# Patient Record
Sex: Male | Born: 1950 | Race: White | Hispanic: No | Marital: Married | Smoking: Former smoker
Health system: Southern US, Community
[De-identification: ages and names within clinical notes are randomized; demographics above are authoritative.]

## PROBLEM LIST (undated history)

## (undated) DIAGNOSIS — R5383 Other fatigue: Secondary | ICD-10-CM

## (undated) DIAGNOSIS — D72819 Decreased white blood cell count, unspecified: Secondary | ICD-10-CM

## (undated) DIAGNOSIS — I82409 Acute embolism and thrombosis of unspecified deep veins of unspecified lower extremity: Secondary | ICD-10-CM

## (undated) DIAGNOSIS — K5790 Diverticulosis of intestine, part unspecified, without perforation or abscess without bleeding: Secondary | ICD-10-CM

## (undated) DIAGNOSIS — M1990 Unspecified osteoarthritis, unspecified site: Secondary | ICD-10-CM

## (undated) DIAGNOSIS — I10 Essential (primary) hypertension: Secondary | ICD-10-CM

## (undated) DIAGNOSIS — E039 Hypothyroidism, unspecified: Secondary | ICD-10-CM

## (undated) DIAGNOSIS — C029 Malignant neoplasm of tongue, unspecified: Secondary | ICD-10-CM

## (undated) DIAGNOSIS — D649 Anemia, unspecified: Secondary | ICD-10-CM

## (undated) DIAGNOSIS — I2699 Other pulmonary embolism without acute cor pulmonale: Secondary | ICD-10-CM

## (undated) HISTORY — DX: Diverticulosis of intestine, part unspecified, without perforation or abscess without bleeding: K57.90

## (undated) HISTORY — DX: Hypothyroidism, unspecified: E03.9

## (undated) HISTORY — PX: KNEE SURGERY: SHX244

## (undated) HISTORY — DX: Decreased white blood cell count, unspecified: D72.819

## (undated) HISTORY — DX: Other pulmonary embolism without acute cor pulmonale: I26.99

## (undated) HISTORY — DX: Other fatigue: R53.83

## (undated) HISTORY — DX: Malignant neoplasm of tongue, unspecified: C02.9

## (undated) HISTORY — DX: Acute embolism and thrombosis of unspecified deep veins of unspecified lower extremity: I82.409

## (undated) HISTORY — DX: Unspecified osteoarthritis, unspecified site: M19.90

## (undated) HISTORY — DX: Anemia, unspecified: D64.9

## (undated) HISTORY — DX: Essential (primary) hypertension: I10

---

## 1999-09-26 ENCOUNTER — Encounter: Payer: Self-pay | Admitting: Orthopedic Surgery

## 1999-09-27 ENCOUNTER — Inpatient Hospital Stay (HOSPITAL_COMMUNITY): Admission: RE | Admit: 1999-09-27 | Discharge: 1999-10-01 | Payer: Self-pay | Admitting: Orthopedic Surgery

## 1999-10-21 ENCOUNTER — Inpatient Hospital Stay (HOSPITAL_COMMUNITY): Admission: RE | Admit: 1999-10-21 | Discharge: 1999-10-23 | Payer: Self-pay | Admitting: Orthopedic Surgery

## 2000-04-30 ENCOUNTER — Inpatient Hospital Stay (HOSPITAL_COMMUNITY): Admission: AD | Admit: 2000-04-30 | Discharge: 2000-05-04 | Payer: Self-pay | Admitting: Orthopedic Surgery

## 2000-05-03 ENCOUNTER — Encounter: Payer: Self-pay | Admitting: Orthopedic Surgery

## 2000-05-04 ENCOUNTER — Encounter: Payer: Self-pay | Admitting: Orthopedic Surgery

## 2000-05-09 ENCOUNTER — Ambulatory Visit (HOSPITAL_COMMUNITY): Admission: RE | Admit: 2000-05-09 | Discharge: 2000-05-09 | Payer: Self-pay | Admitting: Orthopedic Surgery

## 2000-07-18 ENCOUNTER — Encounter (INDEPENDENT_AMBULATORY_CARE_PROVIDER_SITE_OTHER): Payer: Self-pay | Admitting: Specialist

## 2000-07-18 ENCOUNTER — Inpatient Hospital Stay (HOSPITAL_COMMUNITY): Admission: RE | Admit: 2000-07-18 | Discharge: 2000-07-23 | Payer: Self-pay | Admitting: Orthopedic Surgery

## 2000-07-23 ENCOUNTER — Encounter: Payer: Self-pay | Admitting: Orthopedic Surgery

## 2000-08-27 ENCOUNTER — Encounter: Admission: RE | Admit: 2000-08-27 | Discharge: 2000-08-27 | Payer: Self-pay | Admitting: Infectious Diseases

## 2000-11-08 ENCOUNTER — Encounter: Admission: RE | Admit: 2000-11-08 | Discharge: 2000-11-08 | Payer: Self-pay | Admitting: Internal Medicine

## 2000-11-19 ENCOUNTER — Encounter: Payer: Self-pay | Admitting: Orthopedic Surgery

## 2000-11-20 ENCOUNTER — Encounter: Payer: Self-pay | Admitting: Orthopedic Surgery

## 2000-11-20 ENCOUNTER — Encounter (INDEPENDENT_AMBULATORY_CARE_PROVIDER_SITE_OTHER): Payer: Self-pay | Admitting: *Deleted

## 2000-11-20 ENCOUNTER — Inpatient Hospital Stay (HOSPITAL_COMMUNITY): Admission: RE | Admit: 2000-11-20 | Discharge: 2000-11-23 | Payer: Self-pay | Admitting: Orthopedic Surgery

## 2004-07-24 DIAGNOSIS — I2699 Other pulmonary embolism without acute cor pulmonale: Secondary | ICD-10-CM

## 2004-07-24 DIAGNOSIS — I82409 Acute embolism and thrombosis of unspecified deep veins of unspecified lower extremity: Secondary | ICD-10-CM

## 2004-07-24 HISTORY — PX: ABOVE KNEE LEG AMPUTATION: SUR20

## 2004-07-24 HISTORY — DX: Other pulmonary embolism without acute cor pulmonale: I26.99

## 2004-07-24 HISTORY — DX: Acute embolism and thrombosis of unspecified deep veins of unspecified lower extremity: I82.409

## 2005-03-24 ENCOUNTER — Ambulatory Visit: Payer: Self-pay | Admitting: Infectious Diseases

## 2005-03-24 ENCOUNTER — Encounter (INDEPENDENT_AMBULATORY_CARE_PROVIDER_SITE_OTHER): Payer: Self-pay | Admitting: *Deleted

## 2005-03-24 ENCOUNTER — Inpatient Hospital Stay (HOSPITAL_COMMUNITY): Admission: AD | Admit: 2005-03-24 | Discharge: 2005-04-14 | Payer: Self-pay | Admitting: Orthopedic Surgery

## 2005-04-01 ENCOUNTER — Ambulatory Visit: Payer: Self-pay | Admitting: Critical Care Medicine

## 2005-04-10 ENCOUNTER — Ambulatory Visit: Payer: Self-pay | Admitting: Oncology

## 2005-04-11 ENCOUNTER — Encounter (INDEPENDENT_AMBULATORY_CARE_PROVIDER_SITE_OTHER): Payer: Self-pay | Admitting: *Deleted

## 2005-04-19 ENCOUNTER — Ambulatory Visit: Payer: Self-pay | Admitting: Infectious Diseases

## 2005-04-20 ENCOUNTER — Ambulatory Visit: Payer: Self-pay | Admitting: Critical Care Medicine

## 2005-04-24 ENCOUNTER — Ambulatory Visit: Payer: Self-pay | Admitting: Cardiology

## 2005-04-27 ENCOUNTER — Ambulatory Visit (HOSPITAL_COMMUNITY): Admission: RE | Admit: 2005-04-27 | Discharge: 2005-04-27 | Payer: Self-pay | Admitting: Interventional Radiology

## 2005-05-01 ENCOUNTER — Ambulatory Visit: Payer: Self-pay | Admitting: Cardiology

## 2005-05-08 ENCOUNTER — Ambulatory Visit: Payer: Self-pay | Admitting: Internal Medicine

## 2005-05-15 ENCOUNTER — Ambulatory Visit: Payer: Self-pay | Admitting: Cardiology

## 2005-05-15 ENCOUNTER — Ambulatory Visit: Payer: Self-pay | Admitting: Critical Care Medicine

## 2005-06-05 ENCOUNTER — Ambulatory Visit: Payer: Self-pay

## 2005-06-20 ENCOUNTER — Ambulatory Visit: Payer: Self-pay | Admitting: Critical Care Medicine

## 2005-09-20 ENCOUNTER — Encounter: Admission: RE | Admit: 2005-09-20 | Discharge: 2005-11-16 | Payer: Self-pay | Admitting: Orthopedic Surgery

## 2006-05-05 IMAGING — CT CT ANGIO CHEST
1 of 3 series · 18 of 32 positions shown · IV contrast (APPLIED)
Comparison: Plain film of chest 04/02/2005.

CLINICAL DATA: Increasing shortness of breath status post total knee replacement.  
CT ANGIOGRAPHY OF CHEST:
TECHNIQUE: Multidetector CT imaging of the chest was performed during bolus injection of intravenous contrast.  Multiplanar CT angiographic image reconstructions were generated to evaluate the vascular anatomy.
Contrast:  100ml Omnipaque 300.

[Series 10: pulm embolism 2.0 st · axial · 0.61mm/px · z∈[-327,-67]mm · 18 of 142 slices shown]
[im 6/142  lung]
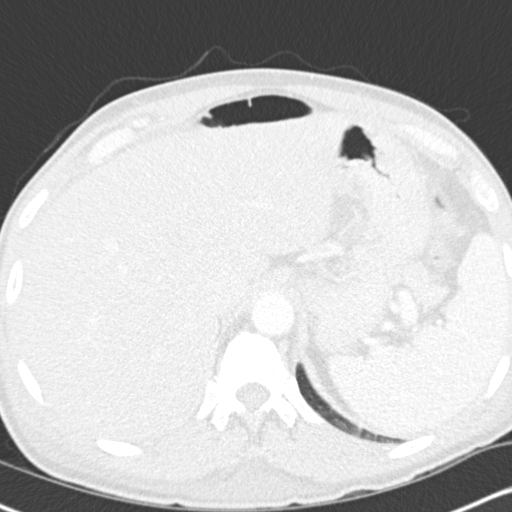
[im 16/142  soft-tissue]
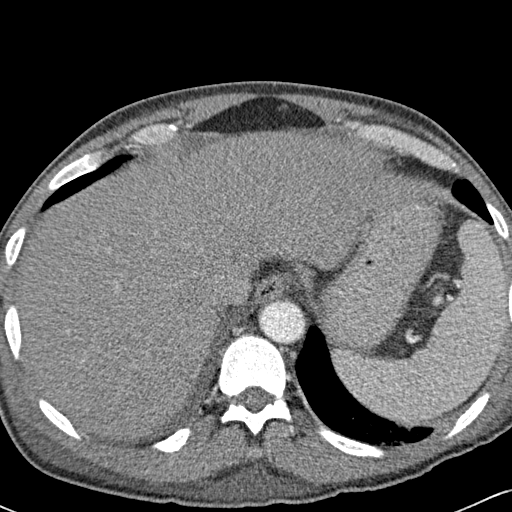
[im 21/142  lung]
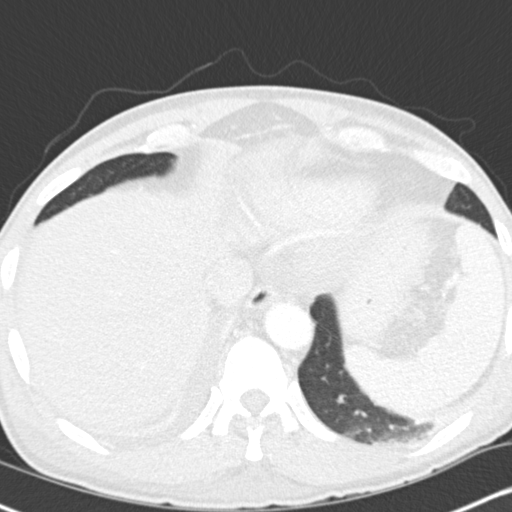
[im 31/142  soft-tissue]
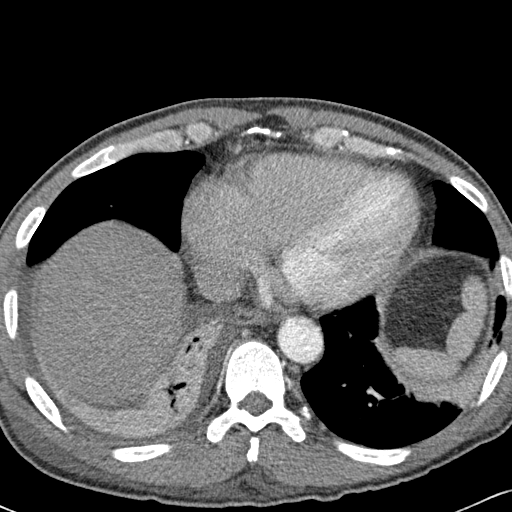
[im 36/142  lung]
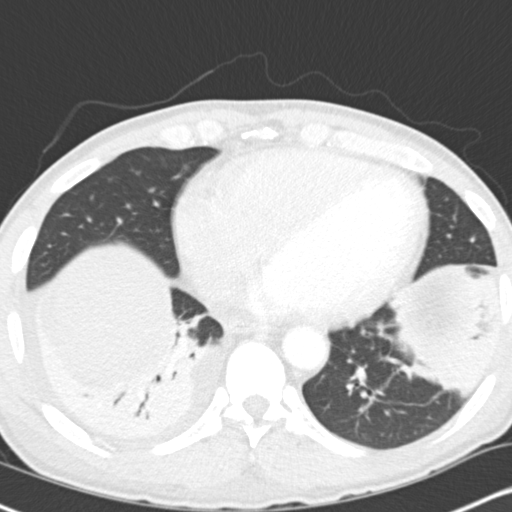
[im 46/142  soft-tissue]
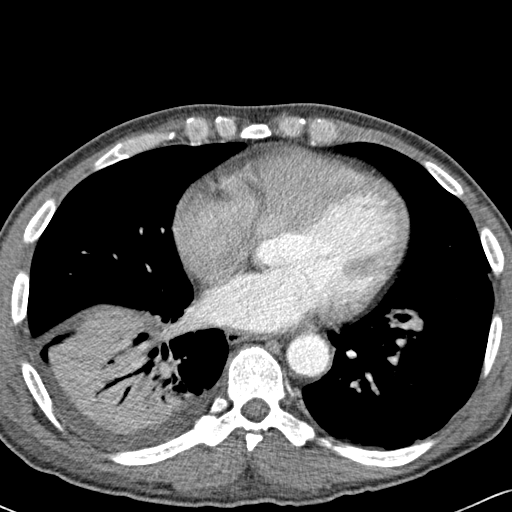
[im 51/142  lung]
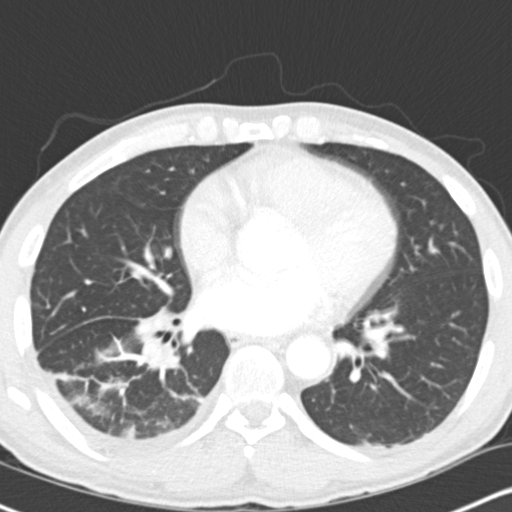
[im 61/142  soft-tissue]
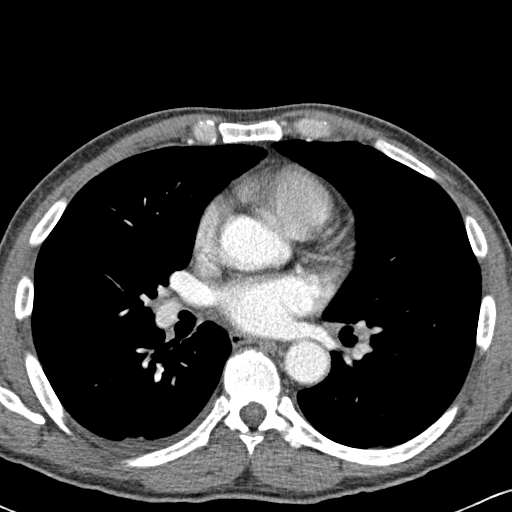
[im 66/142  lung]
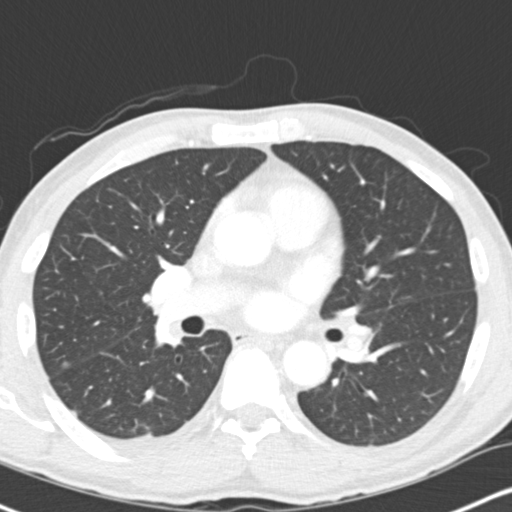
[im 76/142  soft-tissue]
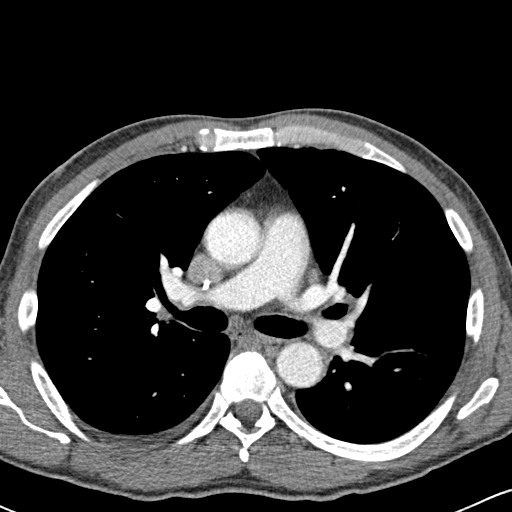
[im 81/142  lung]
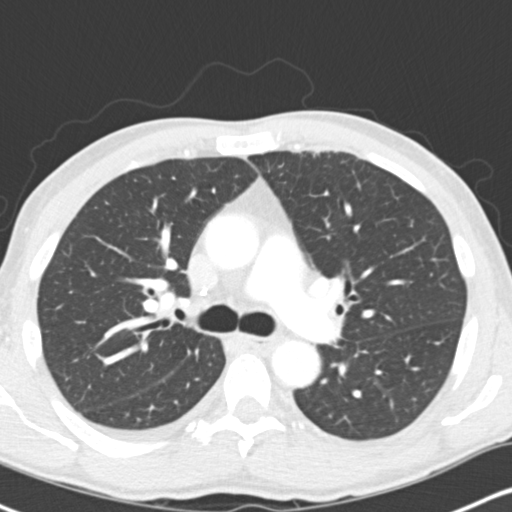
[im 91/142  soft-tissue]
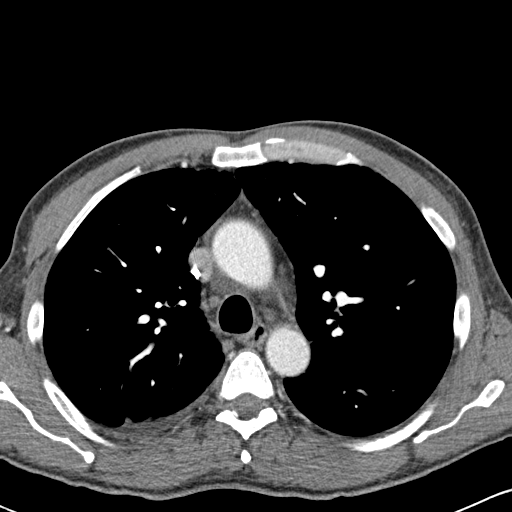
[im 96/142  lung]
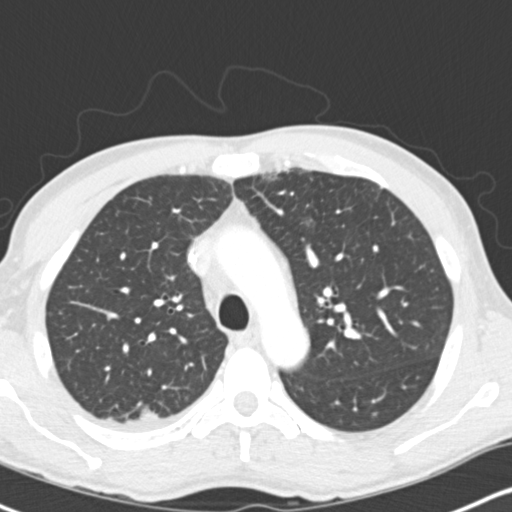
[im 106/142  soft-tissue]
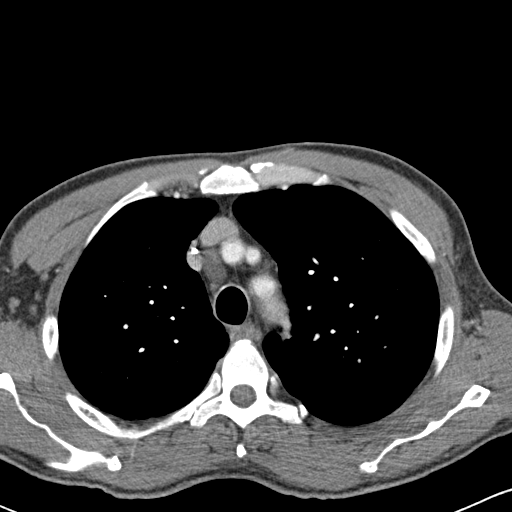
[im 111/142  lung]
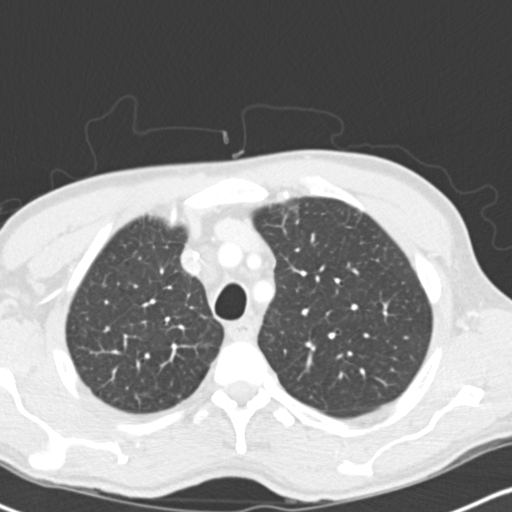
[im 121/142  soft-tissue]
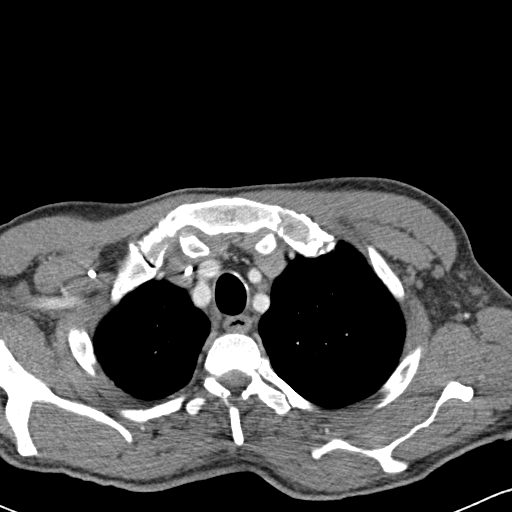
[im 126/142  lung]
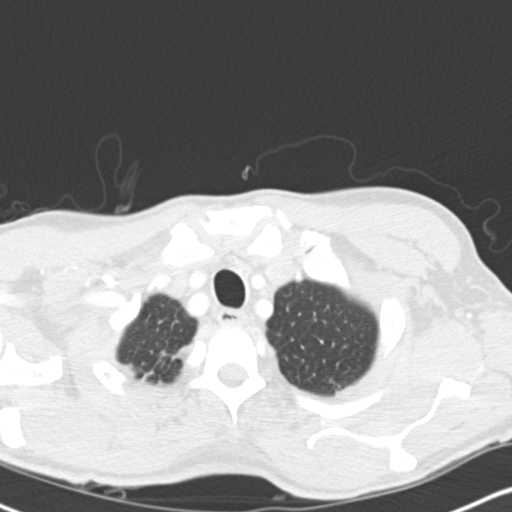
[im 136/142  soft-tissue]
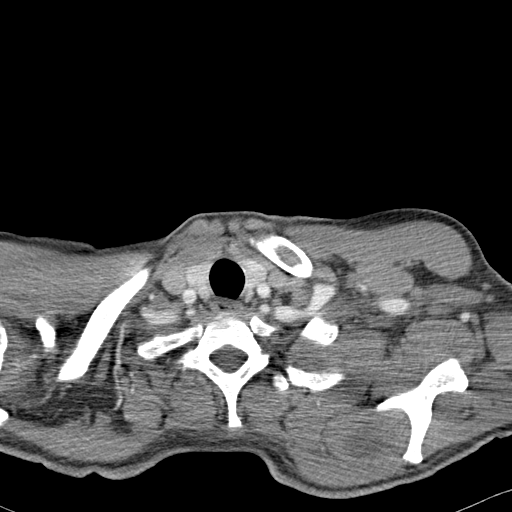

[18 of 32 positions shown; findings below may reference images not displayed]

No prior CTs available.  
Lung windows demonstrate right apical pleural parenchymal scarring.  A 1 to 2 mm nodule is seen in the subpleural right upper lobe on image 38.  Similar nodule right upper lobe image 40.  Numerous other tiny nodules are seen throughout the lungs example image 46 right upper lobe.  There is airspace disease in the right lower lobe without significant volume loss which is most consistent with pneumonia.  More platelike airspace disease is seen in the left lung base and likely represents subsegmental atelectasis. 
The contrast bolus is suboptimally timed.  This limits evaluation for subsegmental and small segmental pulmonary emboli.  Given this limitation, there is no evidence of a large pulmonary embolism.  Please note that especially at the lung bases, pulmonary arterial enhancement is suboptimal. 
Right-sided central line terminates at the superior caval/atrial junction.  Low density in the right paratracheal region felt to represent pericardial recess fluid.  Heart is mildly enlarged.  Small mediastinal nodes none of which meet CT size criterial for adenopathy.  Prominent right hilar nodal tissue likely inflammatory.  Small right-sided pleural effusion which appears simple.  
Limited imaging of the upper abdomen demonstrates hypoattenuation in the liver likely representing hepatic steatosis.  
Bone windows demonstrate no focal osseous lesion.
IMPRESSION: 1.  Mildly limited examination.  No evidence of large pulmonary embolism.  
2.  Evidence of right lower lobe pneumonia with small right-sided pleural effusion.  
3.  Cardiomegaly.  
4.  Scattered lung nodules which are all diminutive.  If there are risk factors for lung cancer, 1-year followup chest CT recommended. 
5.  Fatty liver.

## 2006-05-06 IMAGING — XA IR US GUIDE VASC ACCESS RIGHT
1 series · 8 of 8 positions shown · non-contrast
Comparison: none

CLINICAL DATA: Infected left total knee replacement. DVT. Cannot be
anticoagulated.

[Series 1: run · 8 of 8 slices shown]
[im 1/8]
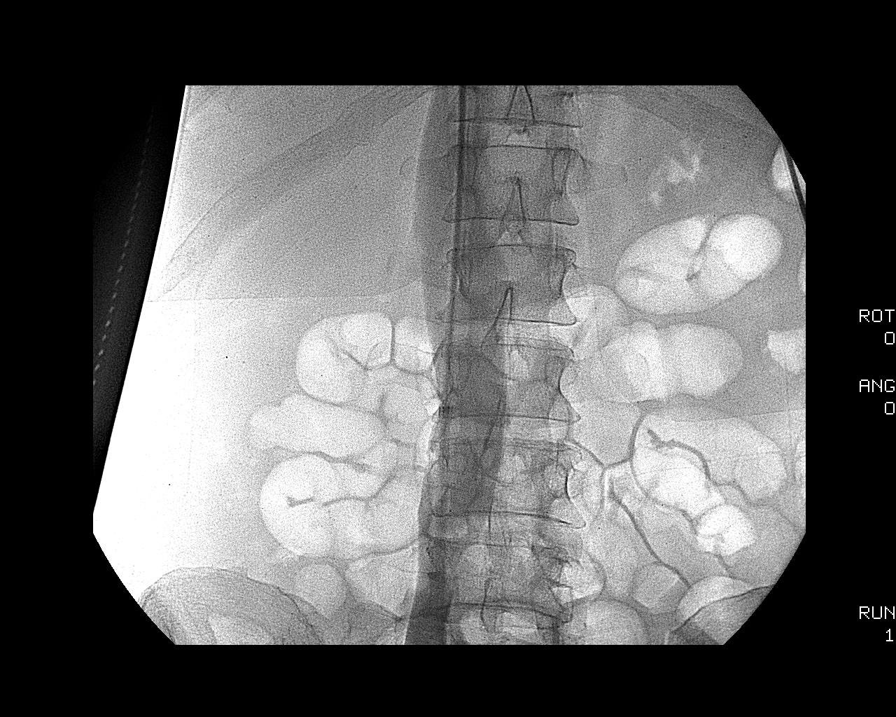
[im 2/8]
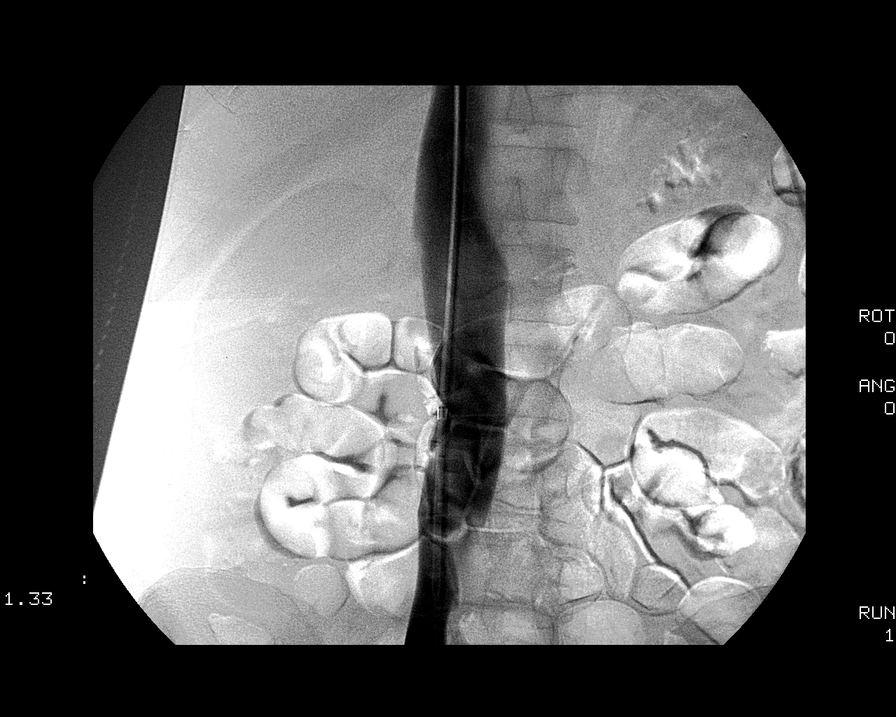
[im 3/8]
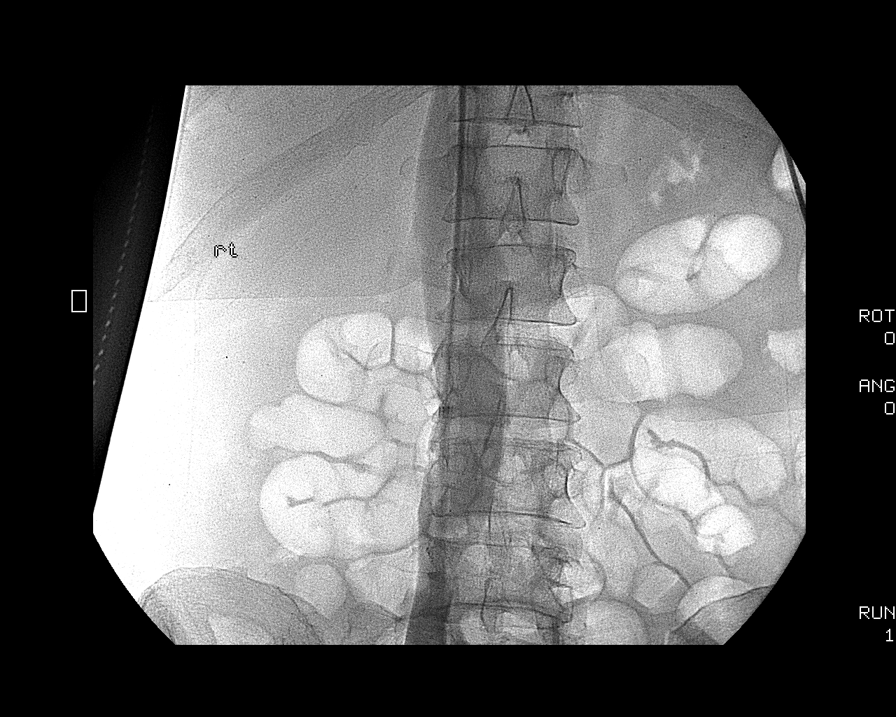
[im 4/8]
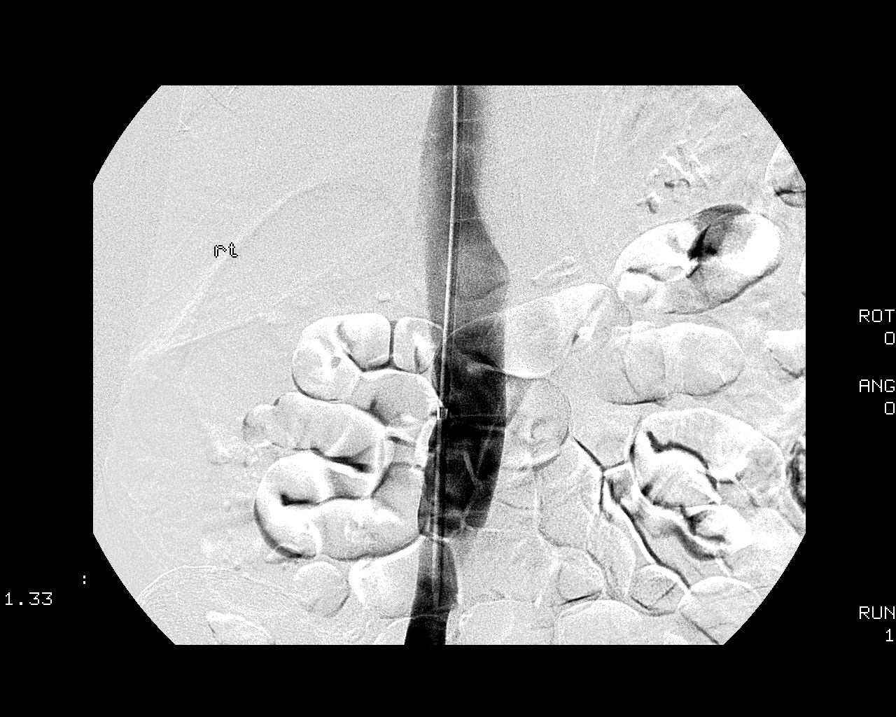
[im 5/8]
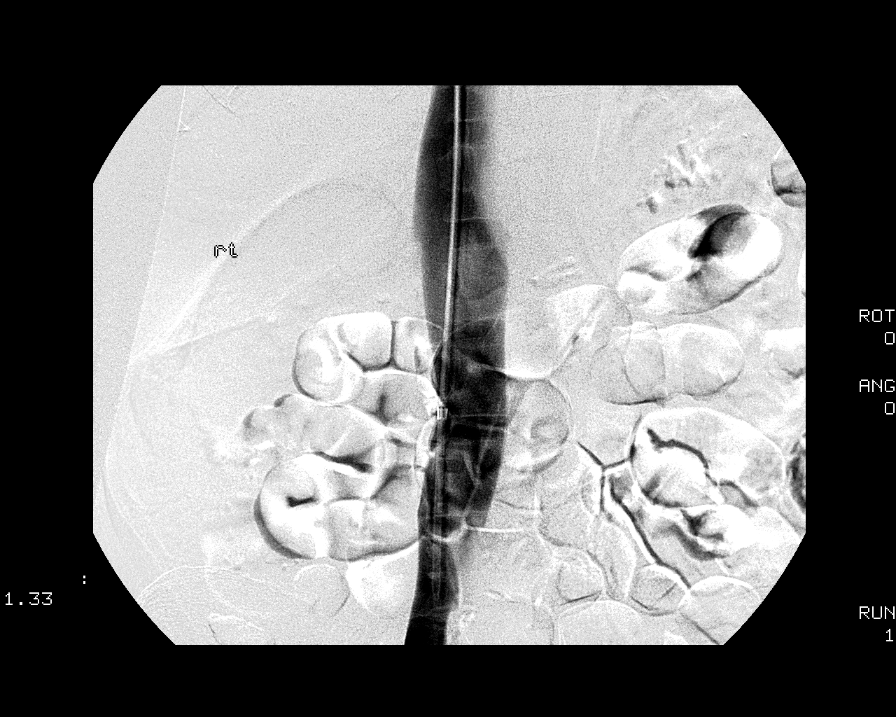
[im 6/8]
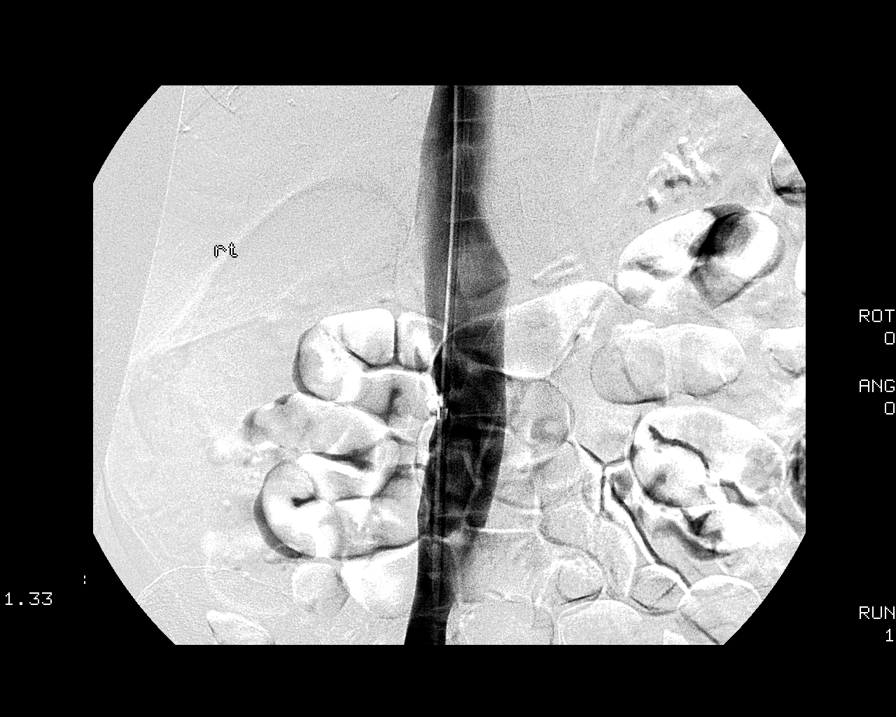
[im 7/8]
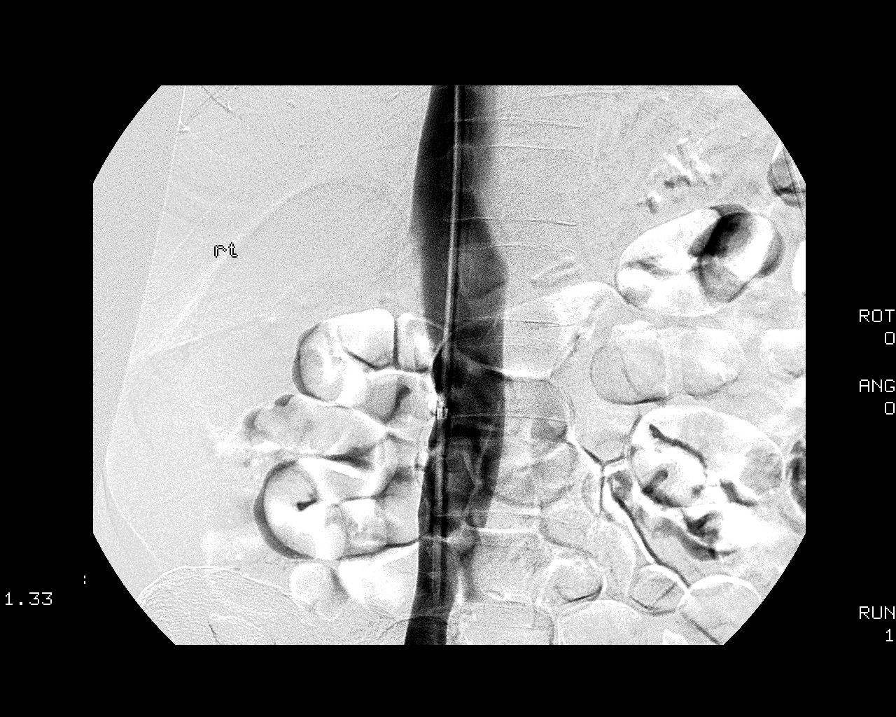
[im 8/8]
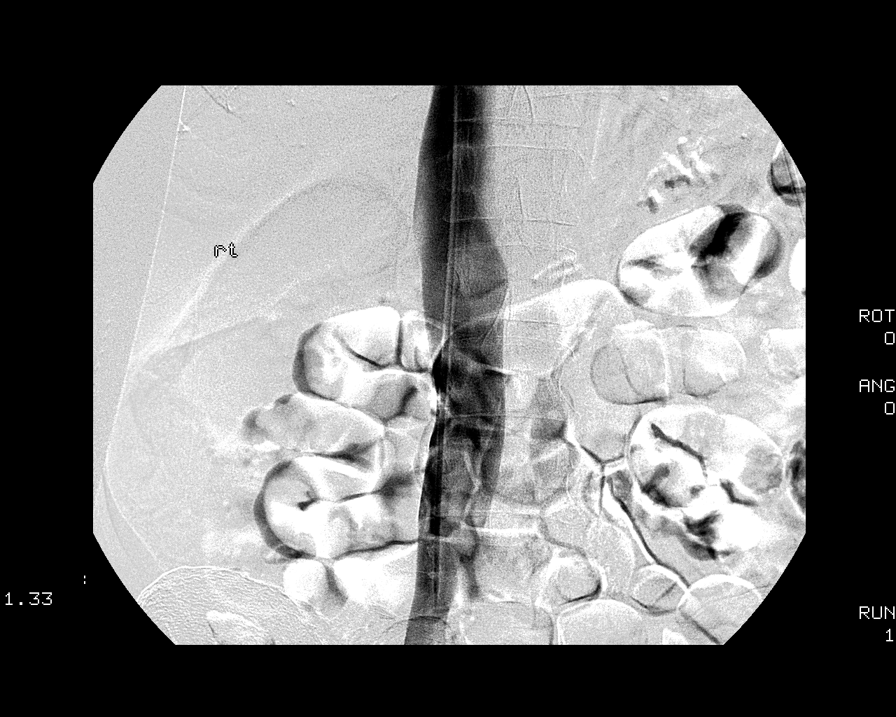

[8 of 8 positions shown; findings below may reference images not displayed]

PROCEDURE:
1. Ultrasound guided vascular access:
2. Inferior venacavogram:
3. IVC filter placement:

Anesthesia/meds: IV Versed and fentanyl for conscious sedation.
Cardiorespiratory monitoring was performed throughout the procedure by the
radiology nurse.

Complications: None

Filter: Retrievable G-2
FINDINGS: Written informed consent was obtained from the patient for the procedure. The
patient was placed supine on the angiography table and the right neck was
prepped and draped in sterile fashion. Ultrasound verifies patency of the right
internal jugular vein. Under ultrasound guidance, a micropuncture needle was
used to access the right internal jugular vein, and a 0.018 guidewire was
advanced centrally under fluoroscopic guidance, then exchanged for a
guidewire through the microconversion sheath. This was advanced into the IVC.
The 10 French introducer sheath was then advanced over the guidewire and used to
perform inferior venacavogram. This shows normal caliber IVC. No intraluminal
filling defects within the IVC to suggest clot. Inflow from the opposite iliac
vein and renal veins was noted.

Under fluoroscopic guidance, the retrievable G-2 IVC filter was deployed in an
infrarenal location.

Following the procedure, hemostasis was achieved at the right neck with manual
compression.
IMPRESSION: 1. Unremarkable inferior venacavogram.
2. Successful placement of infrarenal retrievable G-2 IVC filter. This can be
retrieved within 3 weeks or can function as a permanent filter if felt needed.

## 2006-09-04 ENCOUNTER — Encounter: Admission: RE | Admit: 2006-09-04 | Discharge: 2006-11-23 | Payer: Self-pay | Admitting: Orthopedic Surgery

## 2009-07-24 HISTORY — PX: COLONOSCOPY: SHX174

## 2009-12-03 ENCOUNTER — Encounter (INDEPENDENT_AMBULATORY_CARE_PROVIDER_SITE_OTHER): Payer: Self-pay | Admitting: *Deleted

## 2009-12-17 ENCOUNTER — Encounter (INDEPENDENT_AMBULATORY_CARE_PROVIDER_SITE_OTHER): Payer: Self-pay | Admitting: *Deleted

## 2009-12-24 ENCOUNTER — Ambulatory Visit: Payer: Self-pay | Admitting: Gastroenterology

## 2010-01-11 ENCOUNTER — Ambulatory Visit: Payer: Self-pay | Admitting: Gastroenterology

## 2010-01-14 ENCOUNTER — Encounter: Payer: Self-pay | Admitting: Gastroenterology

## 2010-08-23 NOTE — Letter (Signed)
Summary: [facility] Instructions  [facility]  [address] [place], [place] [address]   Phone: [phone]  Fax: [phone]       Matthieu DICAPRIO    1951/06/12    MRN: [id number]        Procedure Day Dorna Bloom:  Jake Shark  01/11/10     Arrival Time:  9:30am     Procedure Time:  10:30am    Location of Procedure:                    _X _  [facility] (4th Floor)                        PREPARATION FOR COLONOSCOPY WITH MOVIPREP   Starting 5 days prior to your procedure  THURSDAY 01/06/10 do not eat nuts, seeds, popcorn, corn, beans, peas,  salads, or any raw vegetables.  Do not take any fiber supplements (e.g. Metamucil, Citrucel, and Benefiber).  THE DAY BEFORE YOUR PROCEDURE         DATE:  MONDAY  01/10/10  1.  Drink clear liquids the entire day-NO SOLID FOOD  2.  Do not drink anything colored red or purple.  Avoid juices with pulp.  No orange juice.  3.  Drink at least 64 oz. (8 glasses) of fluid/clear liquids during the day to prevent dehydration and help the prep work efficiently.  CLEAR LIQUIDS INCLUDE: Water Jello Ice Popsicles Tea (sugar ok, no milk/cream) Powdered fruit flavored drinks Coffee (sugar ok, no milk/cream) Gatorade Juice: apple, white grape, white cranberry  Lemonade Clear bullion, consomm, broth Carbonated beverages (any kind) Strained chicken noodle soup Hard Candy                             4.  In the morning, mix first dose of MoviPrep solution:    Empty 1 Pouch A and 1 Pouch B into the disposable container    Add lukewarm drinking water to the top line of the container. Mix to dissolve    Refrigerate (mixed solution should be used within 24 hrs)  5.  Begin drinking the prep at 5:00 p.m. The MoviPrep container is divided by 4 marks.   Every 15 minutes drink the solution down to the next mark (approximately 8 oz) until the full liter is complete.   6.  Follow completed prep with 16 oz of clear liquid of your choice  (Nothing red or purple).  Continue to drink clear liquids until bedtime.  7.  Before going to bed, mix second dose of MoviPrep solution:    Empty 1 Pouch A and 1 Pouch B into the disposable container    Add lukewarm drinking water to the top line of the container. Mix to dissolve    Refrigerate  THE DAY OF YOUR PROCEDURE      DATE:  TUESDAY  01/11/10  Beginning at  5:30 a.m. (5 hours before procedure):         1. Every 15 minutes, drink the solution down to the next mark (approx 8 oz) until the full liter is complete.  2. Follow completed prep with 16 oz. of clear liquid of your choice.    3. You may drink clear liquids until 8:30am  (2 HOURS BEFORE PROCEDURE).   MEDICATION INSTRUCTIONS  Unless otherwise instructed, you should take regular prescription medications with a small sip of water   as early  as possible the morning of your procedure.   Additional medication instructions:  Be sure to take your Lisinopril the morning of procedure.         OTHER INSTRUCTIONS  You will need a responsible adult at least 61 years of age to accompany you and drive you home.   This person must remain in the waiting room during your procedure.  Wear loose fitting clothing that is easily removed.  Leave jewelry and other valuables at home.  However, you may wish to bring a book to read or  an iPod/MP3 player to listen to music as you wait for your procedure to start.  Remove all body piercing jewelry and leave at home.  Total time from sign-in until discharge is approximately 2-3 hours.  You should go home directly after your procedure and rest.  You can resume normal activities the  day after your procedure.  The day of your procedure you should not:   Drive   Make legal decisions   Operate machinery   Drink alcohol   Return to work  You will receive specific instructions about eating, activities and medications before you leave.    The above instructions have been  reviewed and explained to me by   Wyona Almas RN  December 24, 2009 1:45 PM     I fully understand and can verbalize these instructions _____________________________ Date _________

## 2010-08-23 NOTE — Miscellaneous (Signed)
Summary: [facility] Previsit/prep  Clinical Lists Changes  Observations: Added new observation of NKA: T (12/24/2009 13:11)    Pt. gets all his meds at the [facility] in [place]

## 2010-08-23 NOTE — Letter (Signed)
Summary: Previsit letter  [facility]  [address] [place], [place] [address]   Phone: [phone]  Fax: [phone]       12/03/2009 MRN: [id number]  Brent [name] [address] [place], [place]  [address]  Dear Mr. SMILES,  Welcome to the Gastroenterology Division at [facility].    You are scheduled to see a nurse for your pre-procedure visit on 12/24/2009 at 1:30PM on the 3rd floor at [facility], [address]  We ask that you try to arrive at our office 15 minutes prior to your appointment time to allow for check-in.  Your nurse visit will consist of discussing your medical and surgical history, your immediate family medical history, and your medications.    Please bring a complete list of all your medications or, if you prefer, bring the medication bottles and we will list them.  We will need to be aware of both prescribed and over the counter drugs.  We will need to know exact dosage information as well.  If you are on blood thinners (Coumadin, Plavix, Aggrenox, Ticlid, etc.) please call our office today/prior to your appointment, as we need to consult with your physician about holding your medication.   Please be prepared to read and sign documents such as consent forms, a financial agreement, and acknowledgement forms.  If necessary, and with your consent, a friend or relative is welcome to sit-in on the nurse visit with you.  Please bring your insurance card so that we may make a copy of it.  If your insurance requires a referral to see a specialist, please bring your referral form from your primary care physician.  No co-pay is required for this nurse visit.     If you cannot keep your appointment, please call [phone] to cancel or reschedule prior to your appointment date.  This allows [place] the opportunity to schedule an appointment for another patient in need of care.    Thank you for choosing [facility] for your medical  needs.  We appreciate the opportunity to care for you.  Please visit [place] at our website  to learn more about our practice.                     Sincerely.                                                                                                                   The Gastroenterology Division

## 2010-08-23 NOTE — Letter (Signed)
Summary: Patient Notice- Polyp Results  [name]  [address] [place], [place] [address]   Phone: [phone]  Fax: [phone]        January 14, 2010 MRN: [id number]    Brent [name] [address] [place], [place]  [address]    Dear Mr. [name],  I am pleased to inform you that the colon polyp(s) removed during your recent colonoscopy was (were) found to be benign (no cancer detected) upon pathologic examination.  I recommend you have a repeat colonoscopy examination in 1_ years to look for recurrent polyps, as having colon polyps increases your risk for having recurrent polyps or even colon cancer in the future.WILL SCHEDULE AT THE HOSPITAL WITH GENERAL ANESTHESIA PER DIFFICULT EXAM OF THE PROXIMAL COLON.POLYPS WERE BENIGN.... Should you develop new or worsening symptoms of abdominal pain, bowel habit changes or bleeding from the rectum or bowels, please schedule an evaluation with either your primary care physician or with me.  Additional information/recommendations:  _X_ No further action with gastroenterology is needed at this time. Please      follow-up with your primary care physician for your other healthcare      needs.  __ Please call [phone] to schedule a return visit to review your      situation.  __ Please keep your follow-up visit as already scheduled.  __ Continue treatment plan as outlined the day of your exam.  Please call us if you are having persistent problems or have questions about your condition that have not been fully answered at this time.  Sincerely,  [name] MD [facility]  This letter has been electronically signed by your physician.  Appended Document: Patient Notice- Polyp Results letter mailed.

## 2010-08-23 NOTE — Procedures (Signed)
Summary: Colonoscopy  Patient: Brent [name] Note: All result statuses are Final unless otherwise noted.  Tests: (1) Colonoscopy (COL)   COL Colonoscopy           DONE     [facility]     [address]     [place], [place]  [address]           COLONOSCOPY PROCEDURE REPORT           PATIENT:  [name], [name]  MR#:  [id number]     BIRTHDATE:  1950-08-04, 58 yrs. old  GENDER:  male     ENDOSCOPIST:  Mykah Shin. Jarold Motto, MD, [facility]     REF. BY:     PROCEDURE DATE:  01/11/2010     PROCEDURE:  Colonoscopy with snare polypectomy     ASA CLASS:  Class III     INDICATIONS:  colorectal cancer screening, average risk     MEDICATIONS:   Fentanyl 100 mcg IV, Versed 8 mg IV           DESCRIPTION OF PROCEDURE:   After the risks benefits and     alternatives of the procedure were thoroughly explained, informed     consent was obtained.  Digital rectal exam was performed and     revealed no abnormalities.   The LB CF-[id number] endoscope     was introduced through the anus and advanced to the descending     colon, limited by a redundant colon, poor preparation, extreme     patient discomfort.    The quality of the prep was poor, using     MoviPrep.  The instrument was then slowly withdrawn as the colon     was fully examined.     <<PROCEDUREIMAGES>>           FINDINGS:  ULTRASONIC FINDINGS:  There were multiple polyps     identified and removed. in the rectum and sigmoid colon. -5 mm     flat polyps hot snare excised.  This was otherwise a normal     examination of the colon. VERY REDUNDANT COLON.30 MT ATTEMPT TO     REACH CECUM UNSUCCESSFUL PER EXRREME REDUNDANCY AND HARD TIME     SEDATING THE PATIENT.   Retroflexed views in the rectum revealed     no abnormalities.    The scope was then withdrawn from the patient     and the procedure completed.           COMPLICATIONS:  None     ENDOSCOPIC IMPRESSION:     1) Polyps, multiple in the rectum and sigmoid colon     2) Otherwise  normal examination     HX. OF NARCOTIC BOWEL SYNDROME AND SEVERE CONSTIPATION.     RECOMMENDATIONS:     1.1 Y F/U WITH PROPOFOL SEDATION FOR HOPRFULLY BETTER SUCESS TO     SEE THE CECUM.     2.DAILY MIRALAX.BID IF NEEDED.     REPEAT EXAM:  No           ______________________________     Vania Rea. Jarold Motto, MD, [facility]           CC:           n.     eSIGNED:   Vania Rea. Tinna Kolker at 01/11/2010 11:44 AM           [name], [id number]  Note: An exclamation mark (!) indicates a result that was not dispersed into the  flowsheet. Document Creation Date: 01/11/2010 11:44 AM _______________________________________________________________________  (1) Order result status: Final Collection or observation date-time: 01/11/2010 11:32 Requested date-time:  Receipt date-time:  Reported date-time:  Referring Physician:   Ordering Physician: Sheryn Bison [phone]) Specimen Source:  Source: Launa Grill Order Number: [id number] Lab site:   Appended Document: Colonoscopy     Procedures Next Due Date:    Colonoscopy: 12/2010

## 2010-12-09 NOTE — Discharge Summary (Signed)
[place]  Patient:    Brent [name], Brent [name]                       MRN: [id number] Adm. Date:  [id number] Disc. Date: [id number] Attending:  Drema Pry Dictator:   Currie Paris Thedore Mins. CC:         Meredith Staggers, M.D.             Ronald L. Ovidio Hanger, M.D.                           Discharge Summary  ADMISSION DIAGNOSES: 1. End-stage degenerative joint disease left knee. 2. Multiple large loose bodies, left knee. 3. Mild hypertension. 4. Hypospadias.  DISCHARGE DIAGNOSES: 1. End-stage degenerative joint disease left knee. 2. Multiple large loose bodies, left knee. 3. Mild hypertension. 4. Hypospadias. 5. Urethral stricture.  PROCEDURE:  1) Left total knee arthroplasty by Jearld Adjutant, M.D.  2) Urethral dilation and insertion of indwelling catheter by Windy Fast L. Ovidio Hanger, M.D.  BRIEF HISTORY:  Brent [name] is a 60 year old male who injured his left knee in [place] and had surgery on it at the time.  Since then he has had chronic problems with the knee and states that he has "marbles" in his knee which move around. e has locking sensations in the knee, but predominantly has deep bone pain with ambulation and positive night pain keeping him from resting.  X-rays of the left knee show marked degenerative changes with multiple loose bodies in the anterior knee compartment.  We followed him along conservatively and he continued to have significant trouble with ambulation secondary to his degenerative arthritis and  loose bodies in his left knee.  Because of his radiographic and clinical findings, he was felt to be a candidate for a left total knee arthroplasty.  He was admitted for this.  LABORATORY DATA:  EKG on admission showed sinus bradycardia with left atrial enlargement and incomplete right bundle branch block.  Hemoglobin on admission was 14.7, hematocrit 41.1.  On postoperative day #1, his hemoglobin was 10.5, #2 9.5,  #3 9.7, #4 8.1 with hematocrit of 24.1.  PT at the  time of admission was 13.2 seconds, INR 1.0, and PTT of 32. His bleeding time was 4.0 minutes.  On the date of discharge, his pro time was 21.4 seconds with an INR of 2.6.  CMET on admission was within normal limits.  Of note, slightly elevated BUN of 26.  Urinalysis on admission was unremarkable.  His urine culture was negative.  CONSULTING PHYSICIANS:  Lucrezia Starch. Ovidio Hanger, M.D., urology.  HOSPITAL COURSE:  The patient underwent left total knee arthroplasty as well described in Jearld Adjutant, M.D.s operative note on September 27, 1999.  This went  without complications.  Postoperatively, the patient was placed on PCA morphine  pump for pain control, subcu Lovenox, and Coumadin antithrombolytic therapy per  pharmacy protocol.  He was also started on Ancef 1 gram IV q.8h. x 48 hours. CPM machine was used to the left knee and physical therapy was ordered for walker ambulation with weightbearing as tolerated.  Postoperatively, the patient was not voiding well and he could not be catheterized intraoperatively and since surgery he was able to void only very small amounts.  He did have some suprapubic distention and tenderness and prior to his surgery had had no urinary problems, but did have a hypospadias.  Urology consult was obtained by Windy Fast L. Ovidio Hanger, M.D. and the  meatus was easily dilated using sounds and a 16 French Foley catheter was passed without obstruction and 1000 cc of clear urine was obtained.  He started the patient on Flomax by mouth.  PCA morphine was changed to PCA Demerol.  On postoperative day #1, he was afebrile, his vital signs were stable.  His left knee dressing was clean and dry.  Cath was soft.  His hemoglobin was 10.5.  He was continued on the PCA Demerol and he was gotten out of bed with physical therapy to the chair, weightbearing as tolerated on the left.  Foley catheter was continued  an additional day.  On postoperative day #2, the patients Foley catheter was discontinued and he was voiding without difficulty, he was taking fluids without difficulty, and he had no complaints.  His vital signs were stable and he was afebrile.  His IV and PCA pump were discontinued, the dressing was changed, and the hemovac drain was pulled.  Physical therapy was continued as well as the use of a CPM machine.  His BUN elevated to 11.8, but this came down.  His hemoglobin was  9.7, hematocrit 26.5, and his INR was 2.4.  On October 01, 1999, the patient was afebrile, his vital signs were stable, his knee wound was benign.  He was discharged to home in improved condition.  ACTIVITY:  Weightbearing as tolerated on the left with the walker.  Home CPM machine will be used.  He was given Coumadin per pharmacy protocol x 1 month postoperative and Darvocet-N 100 p.r.n. for pain.  Home Health Physical Therapy  will be set up as well as CPM machine.  The patient will follow up in our office in one week or sooner should any problems occur. DD:  11/01/99 TD:  11/02/99 Job: [id number]
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
## 2010-12-09 NOTE — Consult Note (Signed)
NAMEVERLAN, GROTZ NO.:  [id number]   MEDICAL RECORD NO.:  [id number]          PATIENT TYPE:  OIB   LOCATION:  5010                         FACILITY:  [facility]   PHYSICIAN:  Lonia Blood, M.D.      DATE OF BIRTH:  12-11-1950   DATE OF CONSULTATION:  03/24/2005  DATE OF DISCHARGE:                                   CONSULTATION   PRIMARY CARE PHYSICIAN:  The patient is unassigned.  Consult request by Dr.  Charlesetta Shanks, orthopedic surgery.   REASON FOR CONSULTATION:  Hypotension.   HISTORY OF PRESENT ILLNESS:  The patient is a 60 year old white male with  history of hypertension, tobacco abuse and alcohol abuse as well as  degenerative joint disease.  The patient has had problem with his left knee  for about 30 years.  He has had at least 11 surgeries so far.  This past  Monday, he started having pain in his left knee and weakness.  He called  into the orthopedist doctor's office yesterday when he could no longer cope.  It was found that he has infected left knee prosthesis and that is a re-  infection also.  He was brought in yesterday and had irrigation and drainage  with extensive debridement of left total knee with removal of all total knee  components and also placement of antibiotic spacers.  He also had removal of  7.3 cannulated cancellous screw and washer.  Cultures were sent out  accordingly.  His estimated blood loss was about 100 ml.  Per the patient's  wife, his blood pressure on arrival at short stay center was also low around  89 systolic.  Since admission to the floor, however, his blood pressure has  also remained that low in the 80s systolic.  The patient does not feel  dizzy.  He looks comfortable; however, his hypotension has remained and that  is the source of this consult.  He had a fever of 102.1 yesterday night.   PAST MEDICAL HISTORY:  Significant for hypertension, tobacco abuse, alcohol  abuse, hypospadias, degenerative joint disease as  indicated with multiple  interventions.   MEDICATIONS PRIOR TO ARRIVAL:  Include atenolol 50 mg daily, lisinopril 40  mg daily, Motrin 600 mg p.r.n. and Vicodin p.r.n.   Currently on morphine PCA and vancomycin that has just been started.  Narcan.  Colace and Dilaudid PCA.   ALLERGIES:  THE PATIENT IS ALLERGIC TO MORPHINE, OXYCODONE AND AMBIEN.   SOCIAL HISTORY:  The patient is married and has three children, all in good  health.  This is his second marriage.  He used to work at the post office.  He smokes about a pack per day and has a total of 38 years pack year  history.  He drinks alcohol daily.  Last alcohol was two days ago.  The  patient said he was taking even more when his pain started.   FAMILY HISTORY:  His mother and father are both alive, mainly history of  hypertension.  No prior history of kidney disease.   REVIEW OF SYSTEMS:  GENERALLY, the patient is awake, alert and communicating  well.  Mainly mentions weakness, otherwise doing fine.  RESPIRATORY:  Denied  any chest pain, shortness of breath or weakness.  HEENT:  The patient denied  any headache.  Denied any pain in his throat.  Denied any earache.  CARDIOVASCULAR:  Denied any shortness of breath, PND, orthopnea.  GI:  Denied any nausea, vomiting, diarrhea or constipation.  Denied any bright  red blood per rectum.  GU:  Denied any dysuria, hematuria, urgency or  frequency.  MUSCULOSKELETAL system:  Mainly pain as in HPI.   EXAM:  Today, temperature is 97.5, although his T-max was 102.1 in the past  24 hours.  Blood pressure 85/52, pulse 98, respiratory rate 20.  GENERAL:  He is very much awake, alert, communicating well, no acute  distress.  HEENT:  PERRL, EOMI.  Mucous membranes look slightly dry.  NECK:  Supple, no JVD, no lymphadenopathy.  RESPIRATORY:  He has good air entry bilaterally, no wheezes or rales.  CARDIOVASCULAR system:  Regular rate and rhythm.  ABDOMEN:  Soft, nontender, with positive bowel  sounds.  EXTREMITIES:  Currently dressed with a vacuum attached to his left knee.  The right lower extremity is fine, no edema, cyanosis or clubbing.   LABS:  Today, white count 5.4, hemoglobin 10.9, platelets 114.  BMET:  Sodium 132, potassium 3.7, chloride 103, CO2 19, glucose 114, BUN 65,  creatinine 4.2, calcium 7.4.  Previous creatinine on admission was 2.6.  PT  INR 16 and 1.3.  Urinalysis showed cloudy urine with some bilirubin, large  blood, some proteinuria but negative leukocyte esterase.  Urine microscopy  showed RBCs 7-10 indicating some hematuria.  A spot urine sodium is less  than 10 and creatinine 1.44.  Total CK is mildly elevated at 1186.  Blood  cultures have been drawn but they have yet to return.   ASSESSMENT:  This is a 60 year old gentleman with evidence of acute renal  failure and hypotension.  Nephrology has already been consulted to deal with  the kidney problem.  Based on the patient's presentation, it looks like the  patient is having prerenal failure.  His hypotension could also be most  likely due to low fluids.  Although he did not lose much blood during  surgery, it appeared like the patient may have had decreased intake in the  past few days.  He may be hypotensive from low fluids.  He could also be  septic based on the fact that he had infected joint, although his picture  clinically doesn't kind of comply with real sepsis.   PLAN:  1.  Hypotension:  My first goal would be to fluid resuscitate patient.  He      has no prior history of cardiac disease.  I  will give him boluses of IV      normal saline, at least start with a liter.  If his blood pressure seems      to be improving on that, I will give him another liter and subsequently      continue on at least 200 cc/h or thereabout to get his systolic blood      pressure above 100.  If that fails, then I will transfer the patient to      step-down and probably start some pressors like dopamine, although  the     patient is currently stable clinically.  I will recommend continuing      with vancomycin and if  there is no improvement, probably adding another      agent.  Agree with ID consult.  If this is sepsis, the antibiotics will      be definitely needed.  The further his FENa is less than 1, however,      means that he is still hypovolemic.  2.  Acute renal failure.  Nephrology is currently following this but I      suspect this is all secondary to hypovolemia.  The patient's urine      output today has picked up some.  It was only 200 in the past 24 hours.      However, in the past two hours alone, he has put out about 250.  Hence,      the patient may be improving clinically.  3.  Hypertension.  His blood pressure is currently low so I agree with      holding off all antihypertensives.  His lisinopril could be making his      kidney function worse.  4.  Alcohol abuse:  I will recommend the patient be on thiamine and folate,      and also on DT watch.  5.  For his tobacco, I have offered him nicotine patch, but the patient at      this time believes he does not need it.  6.  As far has his knee is concerned, will defer that to his primary care      physician for pain control and other medical issues.      Lonia Blood, M.D.  Electronically Signed     LG/MEDQ  D:  03/24/2005  T:  03/24/2005  Job:  [id number]
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
## 2010-12-09 NOTE — Op Note (Signed)
NAME:  MANPREET, STREY NO.:  [id number]   MEDICAL RECORD NO.:  [id number]          PATIENT TYPE:  OIB   LOCATION:  2550                         FACILITY:  [facility]   PHYSICIAN:  Deidre Ala, M.D.    DATE OF BIRTH:  [birth year]   DATE OF PROCEDURE:  03/23/2005  DATE OF DISCHARGE:                                 OPERATIVE REPORT   PREOPERATIVE DIAGNOSIS:  Re-infected left total knee, status post previous  parts removal and reimplantation.   POSTOPERATIVE DIAGNOSIS:  Re-infected left total knee, status post previous  parts removal and reimplantation.   PROCEDURE:  1.  Irrigation and drainage with extensive debridement of left total knee      with removal of all total knee components and also placement of      antibiotic spacers.  2.  Removal of 7.3 cannulated cancellous screw and washer.   SURGEON:  1.  Charlesetta Shanks, M.D.   ATTENDING:  Clarene Reamer, P.A.-C.   ANESTHESIA:  General with LMA.   CULTURES:  Cultures taken previously from the office.   DRAINS:  Two large Hemovacs to self-suction.   ESTIMATED BLOOD LOSS:  Less than 100 mL.   REPLACEMENT:  Without.   TOURNIQUET TIME:  One hour 23 minutes.   PATHOLOGIC FINDINGS AND HISTORY:  Wataru had removal of infected total knee  parts in the past with cement spacers placed, healing of his infection  initially and then reimplantation, April '02, four and a half years ago.  He  has had no problems with his knee and healed uneventfully, until recently he  noted increasing swelling as he was more active, had a low-grade  temperature, came into the office to have some fluid drawn off.  We  suspected infection, aspirated it and got purulent material that showed gram-  positive cocci on the Gram's stain with culture pending.  We therefore gave  him options, but elected to proceed with parts removal and antibiotic  spacers with probable later knee arthrodesis.  At surgery, we found the  femur to be grossly loose  with an infected pannus underneath.  The tibial  tray loosened rather easily with osteotomes and came free, as did the  patella.  We removed the cement with osteotomes, rongeur and rotary bur and  did a thorough synovectomy in all recesses, and removed all soft tissue and  bur to what appeared to be normal-appearing bone.  We did place an  antibiotic spacer using a DePuy mold on the femur side and on the tibial  side we used a new rotating platform, poly, with a stem and tray that filled  up the 30-mm gap, 15 mm of poly before and a 15-mm buildup, and came to full  extension with some slight flexion.  Hemovac drains were used in addition  over single-layer closures with bolsters.   PROCEDURE:  With adequate anesthesia obtained using LMA technique, he had  been given vancomycin preoperatively.  The patient was placed in a supine  position and the left lower extremity was prepped from the toes to the  tourniquet in  the standard fashion.  After standard prepping and draping,  Esmarch exsanguination was used.  The tourniquet was let up at 350, then 375  mmHg.  Incision was deepened sharply in the midline.  Hemostasis was  obtained using the Bovie electrocoagulator.  The old more medial wound was  excised.  We then dissected everything free and everted the patella and  removed the original screw.  We did not have to avulse the patellar tendon.  We then cut the stem of the rotating platform, removed the poly and removed  the femur, which came off en bloc with the cement.  We then used osteotomes  to loosen the tibial side and were able to loosen it and knock it up and  out.  Then thorough meticulous debridement was carried out of all the  synovium, any cement in the bone and especially after the patella was  removed, and then we used a rotary bur to make sure all abnormal bone that  looked either superficially infected or osteomyelitic was removed.  We also  removed any soft tissue that we could  find and in all recesses and over the  bone, and then we thoroughly jet-lavaged with 6 L of normal saline under jet-  lavage and 0.5 L of triple antibiotic solution.  At the end, we then used a  DePuy mold with mineral oil to fashion out of antibiotic cement 1 batch with  1 g of vancomycin on the femur side and then made a similar tray for the  tibial side with a 25-mm insert, making 30-mm gap fill with the knee in full  extension.  When the cement hardened and vancomycin had also been used on  the tibial side, the tourniquet was let down and bleeding points were  cauterized.  We did use cement behind the patella also.  The wound was then  closed with a single-layer closure with #2 Ethilon over red Roxan Hockey  bolsters with skin staples in between.  Hemovac drains were placed in the  medial and lateral gutters and brought out the superolateral portal to self-  suction.  A bulky sterile compressive dressing was applied with knee  immobilizer and the patient, prior to wakeup, had a femoral nerve block  placed and will be admitted to the floor for routine postoperative care and  analgesia, and antibiotics.  He will also have Infectious Disease consult.           ______________________________  V. Charlesetta Shanks, M.D.     VEP/MEDQ  D:  03/23/2005  T:  03/24/2005  Job:  [id number]

## 2010-12-09 NOTE — Consult Note (Signed)
NAMELAWERENCE, DERY NO.:  [id number]   MEDICAL RECORD NO.:  [id number]          PATIENT TYPE:  INP   LOCATION:  5010                         FACILITY:  [facility]   PHYSICIAN:  Leighton Roach. Truett Perna, M.D. DATE OF BIRTH:  [birth year]   DATE OF CONSULTATION:  04/10/2005  DATE OF DISCHARGE:                                   CONSULTATION   His pressure began hematology consultation on the patient Brent [name] in the  record number is [id number] today is 918.   REFERRING PHYSICIAN:  1.  Charlesetta Shanks, M.D.   PATIENT IDENTIFICATION:  Brent [name] is a 60 year old, status post removal of  a left knee prosthesis on March 23, 2005, with placement of an antibiotic  spacer.  He was diagnosed with a left lower extremity deep vein thrombosis  and pulmonary embolism in the postoperative setting.   HISTORY OF PRESENT ILLNESS:  Brent [name] was admitted on March 23, 2005,  with an infection of a left knee prosthesis. He reports undergoing multiple  surgical procedures on the left knee in the past.  He initially injured the  left knee in 1972 while in [place].  He underwent a left knee replacement  surgery in 2002 complicated by recurrent infections.  Approximately one week  prior to hospital admission he noted the onset of warmth and swelling in the  left leg.  He saw Dr. Renae Fickle and was diagnosed with an infection of the left  knee joint prosthesis.   He reports being immobile for several days prior to hospital admission.   He was taken to the operating room on March 23, 2005, and underwent removal  of the knee prosthesis  and placement of antibiotic spacer. He developed a  hypotension/shock syndrome in the postoperative setting, and the hospitalist  service was consulted.   He was placed on prophylactic Coumadin in the postoperative setting.  On  March 28, 2005, he 09/05 he was diagnosed with a partially occlusive  thrombus of the left popliteal vein.  He was continued on Coumadin.   On  March 30, 2005, he was noted to have diaphoresis and tachycardia.   A CT scan of the chest on March 31, 2005, confirmed a pulmonary embolism.   Dr. Delford Field and Dr. Edilia Bo were consulted.  A recommendation was made to  place him on intravenous heparin anticoagulation.  An IVC filter was not  recommended.   The infectious disease service has followed him closely throughout this  hospital admission.  He has been maintained on antibiotic support.  A  decision has been made by orthopedics to proceed with a left above-the-knee  amputation on April 11, 2005.  73.   Brent [name] reports no previous history of stroke, phlebitis, or venous  thromboembolic disease.  There is no family history of venous thrombosis.   PAST MEDICAL HISTORY:  Psoriasis.   PAST SURGICAL HISTORY:  1.  Hemorrhoid surgery in the 1980s.  2.  Multiple surgeries on the left knee.  3.  Other past medical history is arthritis.   FAMILY HISTORY:  His father was diagnosed  with prostate cancer at age 5.  No family history of venous thromboembolic disease.   SOCIAL HISTORY:  He lives in [place].  He is retired and disabled.  Previously worked with the post service.  He smoked one pack of cigarettes  per day prior to this hospital admission.  He drinks alcohol occasionally.   ALLERGIES:  1.  MORPHINE SULFATE.  2.  OXYCODONE/APAP.  3.  AMBIEN.   CURRENT MEDICATIONS:  1.  Thiamine 1 mg every day.  2.  Folic acid 1 mg every day.  3.  Protonix 40 mg every day.  4.  Rifampin 300 mg b.i.d.  5.  Potassium chloride 40 mEq every day.  6.  Tetracycline 250 mg q.i.d.  7.  Restoril 30 mg q.h.s.  8.  Multivitamin every day.  9.  MS Contin 45 mg b.i.d.  10. Atenolol 50 mg every day.  11. Vancomycin 1250 mg q.12h.  12. Heparin IV drip.  13. Cefotan 1 gram IV q.12h.  14. Senokot p.r.n.   REVIEW OF SYSTEMS:  CONSTITUTIONAL:  Negative.  RESPIRATORY:  Negative.  CARDIAC:  Negative.  GENITOURINARY:  Negative.   GASTROINTESTINAL:  Negative.  MUSCULOSKELETAL:  He reports pain and swelling at the left knee.  He has a chronic arthritis  pain affecting on the elbows, and the hips.   PHYSICAL EXAMINATION:  HEENT:  Neck without mass.  LUNGS:  Inspiratory rales at the right lower one half.  No respiratory  distress. CARDIAC:  Regular rhythm.  ABDOMEN:  No hepatosplenomegaly.  No mass.  LYMPHATIC:  No palpable cervical, clavicular, or inguinal nodes. Shoddy  bilateral axillary node versus prominent fat pads.  EXTREMITIES:  There is no edema of the left thigh.  There is a gauze wrap  over the left knee.   LABORATORY DATA:  Labs from admission, hemoglobin 13.6, platelets 150,000,  white count 15.6, ANC 14.4. PT 14.3, PTT 41.   IMPRESSION:  1.  Status post removal of an infected left knee joint prosthesis.  2.  Left lower extremity deep vein thrombosis.  3.  Status post placement of inferior vena cava filter.  4.  Mildly elevated baseline PTT.  5.  History of tobacco use.  6.  Psoriasis.  7.  Plan for left above-knee amputation April 11, 2005.   Brent [name] was diagnosed with a left lower extremity deep vein thrombosis  and pulmonary embolism in the postoperative setting following removal of an  infected left knee prosthesis.   The left deep vein thrombosis may have occurred in the preoperative or  postoperative setting.  He likely had a pulmonary embolism while in the  hospital.   The history of thrombosis and anticoagulation therapy is well outlined by  Dr. Delford Field and Dr. Edilia Bo.  I have a low suspicion for a primary  hypercoagulable syndrome.  He does not have a personal or family history to  suggest this.   The mildly elevated baseline APTT is likely a nonspecific finding.  However,  he may have a lupus anticoagulant affecting the PTT.  I have a low suspicion for antiphospholipid syndrome.  I also have a low suspicion for an  acquired condition associated with venous thromboembolic  disease such as an  underlying malignancy.   RECOMMENDATIONS:  1.  Continue anticoagulation therapy for treatment of the DVT/PE as per Dr.      Delford Field.  2.  Repeat the PTT when off of heparin and is still elevated obtain a mixing  study and lupus anticoagulant panel.  3.  I did not recommend a full hypercoagulable panel.  4.  We will follow him while hospitalized and then as needed on an      outpatient basis.           ______________________________  Leighton Roach Truett Perna, M.D.     GBS/MEDQ  D:  04/10/2005  T:  04/11/2005  Job:  [id number]

## 2010-12-09 NOTE — Consult Note (Signed)
[name], Brent [name] NO.:  [id number]   MEDICAL RECORD NO.:  [id number]          PATIENT TYPE:  OIB   LOCATION:  2550                         FACILITY:  [facility]   PHYSICIAN:  Terrial Rhodes, M.D.DATE OF BIRTH:  Apr 12, 1951   DATE OF CONSULTATION:  03/23/2005  DATE OF DISCHARGE:                                   CONSULTATION   REASON FOR CONSULTATION:  Elevated creatinine.   HISTORY OF PRESENT ILLNESS:  [name] is a 60 year old white male with  past medical history significant for hypertension, tobacco abuse and  degenerative joint disease status post left total knee replacement x2 with  three separate episodes of infected hardware.  He has been admitted by Dr.  Renae Fickle for removal of his hardware with incision and drainage in antibiotic  spacer.  We have been asked to see the patient due to preoperative labs  which reveal an elevated creatinine to 2.6.  Last available creatinine was 1  from 2002, and the patient reports that he does not see doctors regularly  and does not have a known history of kidney disease.  We have no available  data on his creatinine over the last four years and we currently do not have  any urinalysis available.  He did have a history of urinary retention, and  hypospadia that required urologic intervention placing the Foley catheter  back in 2001.   ALLERGIES:  MORPHINE CAUSES ITCHING, AS WELL AS, PERCOCET AND AMBIEN.   PAST MEDICAL HISTORY:  1.  Degenerative joint disease status post left total knee arthroplasty on      September 27, 1999.      1.  Infected left knee status post I&D on April 30, 2000.      2.  Infected left knee requiring removal of hardware with antibiotic          spacer on July 18, 2000.      3.  Status post revision of total knee arthroplasty on November 20, 2000.      4.  Infected hardware March 23, 2005, status post removal on antibiotic          spacer.  2.  Hypertension.  3.  Tobacco abuse.  4.   Hypospadia.  5.  Alcohol abuse.   OUTPATIENT MEDICATIONS:  1.  Atenolol 50 mg daily.  2.  Lisinopril 40 mg daily.  3.  Motrin 600 mg p.r.n.  4.  Vicodin p.r.n.   FAMILY HISTORY:  His mother and father are both alive.  They have high blood  pressure.  No family history of kidney disease.   SOCIAL HISTORY:  He has three children in good health.  He has been married  twice.  He worked for the post office.  He has a 38 pack year tobacco  history and drinks alcohol daily, several beers to mixed drinks, and  according to the patient, when he is really hurting, he drinks a fifth of  whiskey a day.   REVIEW OF SYSTEMS:  GENERAL:  The patient is lethargic but arousable,  somewhat combative, but is oriented to person  and place.  He denies any  nausea, vomiting of chest pain.  His main issue is left knee pain.  PULMONARY: Denies any hemoptysis, productive cough, shortness of breath.  GI:  No nausea, vomiting, hematochezia, bright red blood per rectum.  GU:  No dysuria, hematuria, urgency, frequency or retention.  All other systems  are negative.   PHYSICAL EXAMINATION:  GENERAL APPEARANCE:  Well-developed, well-nourished  man lying in bed who is agitated on inner spurts with periods of  restfulness.  VITAL SIGNS:  Temperature 37.9, pulse 86, blood pressure 107/48, respiratory  rate 16, pulse oximetry 95% on 2 liters.  HEENT:  Normocephalic, atraumatic.  Pupils equal, round and reactive to  light.  Extraocular movements intact.  Nonicteric.  Oral pharynx without  lesions.  NECK:  Supple.  Full range of motion.  No lymphadenopathy.  No bruits.  LUNGS:  Scattered bibasilar crackles.  No dullness to percussion.  CARDIAC:  Regular rate and rhythm at 88.  No precordial appreciated.  ABDOMEN:  Normoactive bowel sounds.  Soft, nontender, nondistended.  No  guarding, rebound or bruits.  EXTREMITIES:  Left knee immobilizer in place.  No edema.   LABORATORY DATA:  Labs from August 30, sodium 138,  potassium 4.1, chloride  102, CO2 22, BUN 40, creatinine 2.6, glucose 105, albumin 4.1, red blood  cell count 15, hemoglobin 13.6, platelets 150,000.   ASSESSMENT/PLAN:  1.  Elevated creatinine.  This is an unclear baseline in this patient who      has not seen medical care for about four years.  Of note, he has been on      an ACE inhibitor and nonsteroidals, and therefore, this could be an      ischemic ATM or could also be progressive nephrosclerosis from his      hypertension.  Also, on a differential would be post infectious      glomerular nephritis given his septic joint.  At this time, we will stop      his ACE inhibitors and nonsteroidals.  We will check urinalysis, renal      ultrasound and SPEP and UPEP and renal dose medications.  Will      discontinue IV fluids, especially those with potassium and continue to      follow I&O's and serum creatinine daily.  No indication for dialysis at      this time.  Will also order an ASO titer for the possibility of post      infectious GN.  2.  Hypertension.  Will stop his Lisinopril.  Continue with Atenolol.      Consider using Lasix.  Will follow.  3.  Metabolic acidosis.  Likely secondary to #1.  Will continue to follow.  4.  Infected left knee.  He is status post removal of hardware and      antibiotic spacer.  Plan for ortho.  5.  Altered mental status.  This is likely secondary to post anesthesia,      pain medications.  Will continue to follow.   Thank you for this consultation.           ______________________________  Terrial Rhodes, M.D.     JC/MEDQ  D:  03/23/2005  T:  03/24/2005  Job:  [id number]
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
## 2010-12-09 NOTE — Op Note (Signed)
[place]. [facility]  Patient:    Brent [name], Brent [name]                       MRN: [id number] Proc. Date: 10/21/99 Adm. Date:  [id number] Attending:  Drema Pry CC:         Currie Paris. Thedore Mins.                           Operative Report  PREOPERATIVE DIAGNOSIS:  Traumatic disruption left medial retinaculum status post left total knee arthroplasty with hematoma.  POSTOPERATIVE DIAGNOSIS:  Traumatic disruption left medial retinaculum status post left total knee arthroplasty with hematoma.  PROCEDURE:  Repair of left knee medial retinaculum with medial reefing, lateral  release and evacuation of hematoma and lavage.  SURGEON:  Jearld Adjutant, M.D.  ASSISTANT:  Currie Paris. Bethune, P.A.-C.  ANESTHESIA:  General endotracheal.  CULTURES:  Aerobic and anaerobic.  DRAINS:  None.  ESTIMATED BLOOD LOSS:  200 cc.  REPLACED:  None.  TOURNIQUET TIME:  Tourniquet time was 13 minutes.  PATHOLOGIC FINDINGS AND HISTORY:  Brent [name] is a 60 year old male who underwent total knee arthroplasty approximately two-and-a-half weeks ago.  About two weeks he fell feeling a rip in his knee.  He had a hematoma on Coumadin and sunrise view showed a tilted, laterally retracted patella.  We felt that he had disrupted his medial retinaculum.  The main wound was healing well and was closed.  NVS was intact.  We, therefore, elected to take him back to the operating room to repair the medial retinaculum.  We took him off his Coumadin, put him on Lovenox.  His prothrobmin time was normal.  Prior hematoma had gone down somewhat but his patella was still very lax to lateral stressing and to apprehension.  At surgery, the retinaculum was stretched.  We tightened it up and reefed it with VMO advancement, lateral release and we evacuated the rest of the clot and hematoma with thoroughly jet lavage of the knee.  PROCEDURE:  With adequate anesthesia obtained using  endotracheal technique, 1 gram Ancef was given IV prophylaxis and another one at tourniquet let down.  The patient was placed in the supine position and the left lower extremity was prepped from the toes to the tourniquet in the standard fashion.  After standard prepping and draping, Esmarch exsanguination was used.  The tourniquet was inflated up to 350 mmHg.  The old wound was incised and dissection was carried down to the medial retinaculum which was reopened where it had stretched and was filled with clot nd contusion.  We opened the medial retinaculum and evacuated bloody joint and fluid and hematoma that was clotted.  We then thoroughly jet lavaged the knee with 3 L of normal saline and one-half L of triple antibiotic solution.  There was no disruption of the joint noted. I then reefed the VMO with #1 Vicryl.  I over-sewed it on the medial retinaculum with #1 PDS all the way down after the initial sutures of #1 Vicryl and I did a  lateral release.  This realigned and tightened the patella.  We then placed some Avitene with thrombin on the oozing, bleeding, scarred surface superiorly and that stopped the bleeding.  The wounds were then closed with #2-0 and #3-0 Vicryl and skin staples.  A bulky, sterile compressive dressing was applied with the knee immobilizer.  The patient, having tolerated the procedure  well, was awakened and taken to the recovery room in satisfactory condition for  routine postoperative CPM, knee immobilizer and routine postoperative care. DD:  10/21/99 TD:  10/21/99 Job: [id number]

## 2010-12-09 NOTE — Discharge Summary (Signed)
[place]. [facility]  Patient:    Brent [name], Brent [name]                       MRN: [id number] Adm. Date:  [id number] Disc. Date: [id number] Attending:  Drema Pry Dictator:   Vilinda Blanks. Moye, P.A.C. CC:         Dr. Maurice March   Discharge Summary  DIAGNOSIS:  Recurrent infection of left total knee, having failed synovectomy and antibiotics.  PROCEDURE:  Removal of left total knee replacement prosthesis and cement with debridement and placement of cement spacer with antibiotics by Dr. Renae Fickle on July 18, 2000.  DISCHARGE MEDICATIONS: 1. Darvocet as needed for pain. 2. Antibiotics as instructed by infectious disease. 3. Lovenox per pharmacy.  DISCHARGE DISPOSITION:  Touchdown weightbearing with walker for activity.  DISCHARGE INSTRUCTIONS:  Dressing changes daily with cast to be applied later in Dr. Philipp Ovens office the week following discharge.  FOLLOW-UP APPOINTMENTS:  With Dr. Renae Fickle and with infectious disease clinic as instructed.  HOSPITAL COURSE:  The patient is a 60 year old male who underwent cemented ______ LCS rotating bearing total knee by Dr. Renae Fickle in March 2001.  He slipped and fell a few weeks later and had a patella realignment procedure done as well.  He did well until October 2001 when he presented with a purulent infection of his left total knee.  He underwent an aspiration of 220 cc of pus in the office and was admitted by Dr. Renae Fickle and had radical synovectomy and spacer swab done.  Postoperatively, he was kept on IV antibiotics for a few weeks and then placed on Levaquin and Rifampin and did well until he stopped his Rifampin nine days prior to this admission secondary to some diarrhea. Two days later, his knees started to swell and a few days prior to this admission he was seen in the office with 4+ effusion.  He remained afebrile but was seen in the office with a swollen knee, at which point an aspiration was performed with production of  210 cc of pus.  An attempt was made to admit him at that point for IV antibiotics and removal of parts and placement of antibiotic spacer.  This was a few days prior to Christmas and the patient adamantly declined to be admitted before returning home and coming back following Christmas.  He was warned of the risk of systemic infection and still opted to wait for admission.  He was admitted finally on July 18, 2000 and underwent procedure as above by Dr. Renae Fickle on that date.  Postoperatively, he was placed on dual antibiotic therapy.  He did have postoperative blood loss anemia with hemoglobin of 7.7 on postoperative day #1 and required transfusion.  He had an inadequate response status post transfusion, only rising to a hemoglobin of 8.8.  He was monitored with subsequent complete blood counts which showed his hemoglobin begin to rise by postoperative day #3.  He was continued on IV Ancef and p.o. Rifampin.  Infectious disease consult was obtained and recommendation for to continue antibiotic therapy for six weeks.  He was discharged with medications and followup instructions as above.  DD:  10/06/00 TD:  10/07/00 Job: [id number]

## 2010-12-09 NOTE — Discharge Summary (Signed)
[place]. [facility]  Patient:    Brent [name], Brent [name]                       MRN: [id number] Adm. Date:  [id number] Disc. Date: [id number] Attending:  Drema Pry Dictator:   Darien Ramus, P.A.-C.                           Discharge Summary  DISCHARGE DIAGNOSIS:  Total knee replacement with resultant septic knee with antibiotic spacer and revision of the total knee replacement.  HOSPITAL PROCEDURES:  Revision of total knee replacement.  ALLERGIES:  MORPHINE, PERCOCET, and AMBIEN.  HISTORY OF PRESENT ILLNESS:  The patient had a total knee replacement which became septic.  He had several lavages in attempt to clear the infection.  As a result, the knee was lavaged and an antibiotic spacer placed within the joint and he is currently coming back to have antibiotic spacer removed and his total knee replacement revised.  PAST MEDICAL HISTORY:  Significant for left total knee replacement.  SOCIAL HISTORY:  Noncontributory.  FAMILY HISTORY:  His father has hypertension.  HOSPITAL COURSE:  The patient was admitted to [facility]. [facility] on November 20, 2000, where he underwent revision of his total knee replacement. He did exceptionally well through the course of his recovery suffering no untoward events.  Cultures taken during the surgery all were returned negative and he was discharged home on Nov 23, 2000.  CONDITION ON DISCHARGE:  Improved.  DISCHARGE MEDICATIONS:  Darvocet, Lovenox, Levaquin, and Rifampin.  LABS:  The latest labs were on Nov 23, 2000.  The wbc was 7.6, rbc 2.7, hemoglobin 8.0, hematocrit 22.6.  PT 16.9, INR 1.6.  Sodium 135, potassium 3.4, chloride 104, CO2 26, glucose 114, BUN 9, creatinine 1.0, and calcium 7.9.  PATIENT INSTRUCTIONS:  The patient was sent home with a knee immobilizer and was told not to bend his knee and no weightbearing.  He had no restrictions on his diet.  He was to keep his wound clean and dry.   He was to call the office if temperature was greater than 101 and he was also to call the office for an appointment on Nov 26, 2000. DD:  12/13/00 TD:  12/13/00 Job: [id number]

## 2010-12-09 NOTE — Consult Note (Signed)
Brent [name], BUCKWALTER NO.:  [id number]   MEDICAL RECORD NO.:  [id number]          PATIENT TYPE:  INP   LOCATION:  5010                         FACILITY:  [facility]   PHYSICIAN:  Shan Levans, M.D. LHCDATE OF BIRTH:  [birth year]   DATE OF CONSULTATION:  04/01/2005  DATE OF DISCHARGE:                                   CONSULTATION   CHIEF COMPLAINT:  Pulmonary embolism.   HISTORY OF PRESENT ILLNESS:  A 60 year old white male admitted on March 23, 2005, with an infected left knee.  History is significant in that he had  history of hypertension, tobacco use, alcohol use, and degeneration of left  knee.  He has had multiple problems with the left knee for over 30 years and  multiple surgeries in the past.  Almost 2 weeks ago he developed increased  pain and infected knee and was found to have a prosthetic knee infection.  He was brought into the hospital on March 23, 2005, and went to surgery to  have irrigation, drainage extensively to the left knee with removal of all  total knee components and placement of antibiotic spacer.  Also removed  screws and washers from the knee. Cultures were sent.  He had hypotension  and septic shock type appearance postop, and [facility] service  saw this patient on March 24, 2005, and conducted septic shock  resuscitation on this patient.  Subsequent to this, the patient had Coumadin  started and was seen in consultation by infectious disease.   The patient developed increasing shortness of breath over the next several  days.  There was a delay in getting the INR therapeutic on Coumadin given  postop, finally becoming therapeutic by March 26, 2005, when the INR was  3.1.  The patient had not received any heparin or Lovenox postop.  Subsequent to this, the patient continued to have shortness of breath, did  have resolution of his renal insufficiency.  He was maintained on the  Coumadin.  The patient was noted to have  increased swelling in the leg. A  venous Doppler ultrasound was done on March 28, 2005, which showed a left  non-occlusive popliteal thrombus.  Subsequent to this, the patient continued  on the Coumadin therapy without Lovenox or heparin and was ambulated  further.  Had more shortness of breath and then had an acute event with  fever of 101 degrees on March 30, 2005, as well as a spell of decreased  responsiveness and shaking uncontrollably.  The patient was also hypoxic  with saturation down to 72% on March 30, 2005.  Placed on oxygen.  Subsequent to this, a CT scan of the chest was given consideration but held  off on.  An order was written to restart heparin once INR was less than 2.  The INR at that point was 2.9.   The patient's status continued to not change.  Therefore, a CT chest was  obtained on March 31, 2005.  Result was followed up on today and showed a  pulmonary embolism in the left lower lobe. We are asked to  assess this  patient's status at this time and make recommendations on anticoagulation.  Decision is being made whether to do a left AKA or a knee fusion.   PAST SURGICAL HISTORY:  Multiple operations to left knee.   PAST MEDICAL HISTORY:  Hypertension.   No history of diabetes, increased cholesterol, previous MI, or heart  failure.   MEDICATIONS PRIOR TO ARRIVAL:  Atenolol, Lisinopril, Motrin, Vicodin.   ALLERGIES:  Allergic to MORPHINE, OXYCODONE, and AMBIEN.   SOCIAL HISTORY:  He is married, has 3 children.  This is his second  marriage.  Used to work in [employer], is now disabled.  Still has  alcohol and tobacco use.   FAMILY HISTORY:  Mother and father are alive.  History of hypertension in  the family only.   CURRENT MEDICATIONS:  1.  Vitamin B1.  2.  Folic acid.  3.  Atenolol 25 mg daily.  4.  Protonix 40 mg daily.  5.  Rifadin 300 mg b.i.d.  6.  Vancomycin IV.   PHYSICAL EXAMINATION:  VITAL SIGNS:  T-max 103.5, blood pressure  132/73,  pulse 82, respirations 20, O2 saturation 96% on room air.  CHEST:  Clear bilaterally to auscultation and percussion.  There were  decreased breath sounds at the bases.  CARDIAC:  Regular rate and rhythm without S3.  Normal S1 and S2.  ABDOMEN:  Soft, nontender.  EXTREMITIES:  No edema or clubbing.  SKIN:  Clear.   LABORATORY DATA:  Hemoglobin 8.9, white count 12.9, platelet count 270,000.  INR as of today is 3 to 2.9.   Chest CT scan shows a large intralobar left lower pulmonary embolus.   Chest x-ray shows lower lung zone atelectasis.   IMPRESSION:  Pulmonary embolism in the face of therapeutic INR and deep vein  thrombosis.  Unfortunately, Coumadin does not treat acute thromboses, is a  useful preventative measure and also useful in preventing clot  propagation  but is ineffective in treating active thrombosis or active pulmonary emboli.  In this case, more than likely the patient had deep vein thrombosis on  admission and then through the emboli in small amount and then to a larger  degree on March 30, 2005.  The patient has a complicated history and will  need further surgical intervention on the left knee that is infected and now  has antibiotic spacers in place.  Current IV antibiotics are per infectious  disease.  Note is made of negative blood cultures.  There has been a  Staphylococcus aureus infection also in the past.   RECOMMENDATIONS:  1.  Institute IV heparin therapy so can have better control when we start      and stop his medications for full anticoagulation.  2.  Discontinue further Coumadin.  3.  Would hold off any planned surgical intervention for at least 5 days.  4.  May need to give consideration to a retractable inferior vena cava      filter so that patient can undergo surgery as planned in 5 to 6 days.      At this time, an inferior vena cava filter does not appear to be      immediately indicated. 5.  As well, we will continue to follow this  patient closely.  Should      administer supplemental oxygen therapy as prescribed.  6.  Antibiotics are per the infectious disease service.      Shan Levans, M.D. [facility]  Electronically Signed  PW/MEDQ  D:  04/01/2005  T:  04/01/2005  Job:  [id number]   cc:   Deidre Ala, M.D.  Fax: [phone]

## 2011-02-08 ENCOUNTER — Encounter: Payer: Self-pay | Admitting: Gastroenterology

## 2011-08-29 ENCOUNTER — Other Ambulatory Visit: Payer: Self-pay | Admitting: Physician Assistant

## 2012-04-12 ENCOUNTER — Encounter: Payer: Self-pay | Admitting: Gastroenterology

## 2013-01-02 ENCOUNTER — Other Ambulatory Visit (HOSPITAL_COMMUNITY): Payer: Self-pay | Admitting: Otolaryngology

## 2013-01-02 ENCOUNTER — Other Ambulatory Visit: Payer: Self-pay | Admitting: Otolaryngology

## 2013-01-02 DIAGNOSIS — C029 Malignant neoplasm of tongue, unspecified: Secondary | ICD-10-CM

## 2013-01-03 ENCOUNTER — Other Ambulatory Visit: Payer: Self-pay | Admitting: Otolaryngology

## 2013-01-03 ENCOUNTER — Other Ambulatory Visit: Payer: Self-pay | Admitting: Oncology

## 2013-01-03 DIAGNOSIS — D3709 Neoplasm of uncertain behavior of other specified sites of the oral cavity: Secondary | ICD-10-CM

## 2013-01-06 ENCOUNTER — Encounter (HOSPITAL_COMMUNITY)
Admission: RE | Admit: 2013-01-06 | Discharge: 2013-01-06 | Disposition: A | Discharge status: Home / Self Care | Payer: Medicare Other | Source: Ambulatory Visit | Attending: Otolaryngology | Admitting: Otolaryngology

## 2013-01-06 ENCOUNTER — Telehealth: Payer: Self-pay | Admitting: Oncology

## 2013-01-06 DIAGNOSIS — C029 Malignant neoplasm of tongue, unspecified: Secondary | ICD-10-CM | POA: Insufficient documentation

## 2013-01-06 LAB — GLUCOSE, CAPILLARY: Glucose-Capillary: 105 mg/dL — ABNORMAL HIGH (ref 70–99)

## 2013-01-06 MED ORDER — FLUDEOXYGLUCOSE F - 18 (FDG) INJECTION
18.9000 | Freq: Once | INTRAVENOUS | Status: AC | PRN
  Administered 2013-01-06: 18.9 via INTRAVENOUS

## 2013-01-06 NOTE — Telephone Encounter (Signed)
C/D 01/06/13 for appt. 01/10/13

## 2013-01-08 ENCOUNTER — Encounter: Payer: Self-pay | Admitting: Oncology

## 2013-01-08 NOTE — Progress Notes (Signed)
Head and Neck Cancer Location of Tumor / Histology:  Left lateral/ventral side of tongue.  Patient presented last year with an ulcer on his left lateral tongue.  He recalls being told that it might be from dental trauma.  He claims this came up and went back down again several times over the past year.  Over the last 2-3 weeks, he has had rapidly progressive swelling and tenderness on the lateral left tongue.  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  Nutrition Status:  Weight changes: 20 lbs weight loss, but changed to low fat diet one year ago  Swallowing status: No  Plans, if any, for PEG tube: Unknown at present time  Tobacco/Marijuana/Snuff/ETOH use: current every day smoker - stopped 1 week ago, 40 pack per year history, past heavy alcohol consumption  Past/Anticipated interventions by otolaryngology, if any: Biopsy on 01/02/13 /  possible surgery to tongue and lymph node removal in neck.  Past/Anticipated interventions by medical oncology, if any: To see Dr. Gaylyn Rong on 01/10/13  Referrals yet, to any of the following?  Social Work? Will Schedule  Dentistry? Dr. Kristin Bruins 01/09/2013  Swallowing therapy?  Nutrition? Will schedule  Med/Onc? Dr. Gaylyn Rong 01/10/2013  PEG placement?unknown  SAFETY ISSUES:  Prior radiation? no  Pacemaker/ICD? no  Possible current pregnancy? no  Is the patient on methotrexate? no  Current Complaints / other details: States his tongue is sore and grades this as a level 5 on a scale of 0-10.  Asking for discussion and potential treatment for anxiety/depression during the treatment process, but currently denies either.

## 2013-01-09 ENCOUNTER — Encounter (HOSPITAL_COMMUNITY): Payer: Self-pay | Admitting: Dentistry

## 2013-01-09 ENCOUNTER — Other Ambulatory Visit: Payer: Medicare Other

## 2013-01-09 ENCOUNTER — Ambulatory Visit
Admission: RE | Admit: 2013-01-09 | Discharge: 2013-01-09 | Disposition: A | Discharge status: Home / Self Care | Payer: Medicare Other | Source: Ambulatory Visit | Attending: Otolaryngology | Admitting: Otolaryngology

## 2013-01-09 ENCOUNTER — Ambulatory Visit
Admission: RE | Admit: 2013-01-09 | Discharge: 2013-01-09 | Disposition: A | Discharge status: Home / Self Care | Payer: Self-pay | Source: Ambulatory Visit | Attending: Radiation Oncology | Admitting: Radiation Oncology

## 2013-01-09 ENCOUNTER — Ambulatory Visit (HOSPITAL_COMMUNITY): Payer: Self-pay | Admitting: Dentistry

## 2013-01-09 ENCOUNTER — Ambulatory Visit
Admission: RE | Admit: 2013-01-09 | Discharge: 2013-01-09 | Disposition: A | Discharge status: Home / Self Care | Payer: Federal, State, Local not specified - PPO | Source: Ambulatory Visit | Attending: Radiation Oncology | Admitting: Radiation Oncology

## 2013-01-09 ENCOUNTER — Encounter: Payer: Self-pay | Admitting: Radiation Oncology

## 2013-01-09 VITALS — BP 109/69 | HR 65 | Temp 97.5°F

## 2013-01-09 VITALS — BP 132/79 | HR 73 | Temp 98.8°F | Ht 68.0 in | Wt 126.9 lb

## 2013-01-09 DIAGNOSIS — C01 Malignant neoplasm of base of tongue: Secondary | ICD-10-CM

## 2013-01-09 DIAGNOSIS — IMO0002 Reserved for concepts with insufficient information to code with codable children: Secondary | ICD-10-CM

## 2013-01-09 DIAGNOSIS — K036 Deposits [accretions] on teeth: Secondary | ICD-10-CM

## 2013-01-09 DIAGNOSIS — C021 Malignant neoplasm of border of tongue: Secondary | ICD-10-CM

## 2013-01-09 DIAGNOSIS — K089 Disorder of teeth and supporting structures, unspecified: Secondary | ICD-10-CM

## 2013-01-09 DIAGNOSIS — Z0189 Encounter for other specified special examinations: Secondary | ICD-10-CM

## 2013-01-09 DIAGNOSIS — C029 Malignant neoplasm of tongue, unspecified: Secondary | ICD-10-CM

## 2013-01-09 DIAGNOSIS — K0530 Chronic periodontitis, unspecified: Secondary | ICD-10-CM

## 2013-01-09 DIAGNOSIS — D3709 Neoplasm of uncertain behavior of other specified sites of the oral cavity: Secondary | ICD-10-CM

## 2013-01-09 MED ORDER — SODIUM FLUORIDE 1.1 % DT GEL: DENTAL | Status: DC

## 2013-01-09 MED ORDER — IOHEXOL 300 MG/ML  SOLN
75.0000 mL | Freq: Once | INTRAMUSCULAR | Status: AC | PRN
  Administered 2013-01-09: 75 mL via INTRAVENOUS

## 2013-01-09 NOTE — Patient Instructions (Signed)
RADIATION THERAPY AND DECISIONS REGARDING YOUR TEETH  Xerostomia (dry mouth) Your salivary glands may be in the filed of radiation.  Radiation may include all or part of your saliva glands.  This will cause your saliva to dry up and you will have a dry mouth.  The dry mouth will be for the rest of your life unless your radiation oncologist tells you otherwise.  Your saliva has many functions:  Saliva wets your tongue for speaking.  It coats your teeth and the inside of your mouth for easier movement.  It helps with chewing and swallowing food.  It helps clean away harmful acid and toxic products made by the germs in your mouth, therefore it helps prevent cavities.  It kills some germs in your mouth and helps to prevent gum disease.  It helps to carry flavor to your taste buds.  Once you have lost your saliva you will be at higher risk for tooth decay and gum disease.  What can be done to help improve your mouth when there's not enough saliva:  1.  Your dentist may give a prescription for Salagen.  It will not bring back all of your saliva but may bring back some of it.  Also your saliva may be thick and ropy or white and foamy. It will not feel like it use to feel.  2.  You will need to swish with water every time your mouth feels dry.  YOU CANNOT suck on any cough drops, mints, lemon drops, candy, vitamin C or any other products.  You cannot use anything other than water to make your mouth feel less dry.  If you want to drink anything else you have to drink it all at once and brush afterwards.  Be sure to discuss the details of your diet habits with your dentist or hygienist.  Radiation caries: This is decay that happens very quickly once your mouth is very dry due to radiation therapy.  Normally cavities take six months to two years to become a problem.  When you have dry mouth cavities may take as little as eight weeks to cause you a problem.  This is why dental check ups every two  months are necessary as long as you have a dry mouth. Radiation caries typically, but not always, start at your gum line where it is hard to see the cavity.  It is therefore also hard to fill these cavities adequately.  This high rate of cavities happens because your mouth no longer has saliva and therefore the acid made by the germs starts the decay process.  Whenever you eat anything the germs in your mouth change the food into acid.  The acid then burns a small hole in your tooth.  This small hole is the beginning of a cavity.  If this is not treated then it will grow bigger and become a cavity.  The way to avoid this hole getting bigger is to use fluoride every evening as prescribed by your dentist.  You have to make sure that your teeth are very clean before you use the fluoride.  This fluoride in turn will strengthen your teeth and prepare them for another day of fighting acid.  If you develop radiation caries many times the damage is so large that you will have to have all your teeth removed.  This could be a big problem if some of these teeth are in the field of radiation.  Further details of why this could be   a big problem will follow.  (See Osteoradionecrosis).  Loss of taste (dysgeusia) This happens to varying degrees once you've had radiation therapy to your jaw region.  Many times taste is not completely lost but becomes limited.  The loss of taste is mostly due to radiation affecting your taste buds.  However if you have no saliva in your mouth to carry the flavor to your taste buds it would be difficult for your taste buds to taste anything.  That is why using water or a prescription for Salagen prior to meals and during meals may help with some of the taste.  Keep in mind that taste generally returns very slowly over the course of several months or several years after radiation therapy.  Don't give up hope.  Trismus According to your Radiation Oncologist your TMJ or jaw joints are going to be  partially or fully in the field of radiation.  This means that over time the muscles that help you open and close your mouth may get stiff.  This will potentially result in your not being able to open your mouth wide enough or as wide as you can open it now.  Le me give you an example of how slowly this happens and how unaware people are of it.  A gentlemen that had radiation therapy two years ago came back to me complaining that bananas are just too large for him to be able to fit them in between his teeth.  He was not able to open wide enough to bite into a banana.  This happens slowly and over a period of time.  What do we do to try and prevent this?  Your dentist will probably give you a stack of sticks called a trismus exercise device .  This stack will help your remind your muscles and your jaw joint to open up to the same distance every day.  Use these sticks every morning when you wake up according to the instructions given by the dentist.   You must use these sticks for at least one to two years after radiation therapy.  The reason for that is because it happens so slowly and keeps going on for about two years after radiation therapy.  Your hospital dentist will help you monitor your mouth opening and make sure that it's not getting smaller.  Osteoradionecrosis (ORN) This is a condition where your jaw bone after having had radiation therapy becomes very dry.  It has very little blood supply to keep it alive.  If you develop a cavity that turns into an abscess or an infection then the jaw bone does not have enough blood supply to help fight the infection.  At this point it is very likely that the infection could cause the death of your jaw bone.  When you have dead bone it has to be removed.  Therefore you might end up having to have surgery to remove part of your jaw bone, the part of the jaw bone that has been affected.   Healing is also a problem if you are to have surgery in the areas where the bone  has had radiation therapy.  The same reasons apply.  If you have surgery you need more blood supply which is not available.  When blood supply and oxygen are not available again, there is a chance for the bone to die.  Occasionally ORN happens on its own with no obvious reason.  This is quite rare.  We believe that   patients who continue to smoke and/or drink alcohol have a higher chance of having this bone problem.  Therefore once your jaw bone has had radiation therapy if there are any teeth in that area, you should never have them pulled.  You should also never have any surgery on your teeth or gums in that area unless the oral surgeon or Periodontist is aware of your history of radiation. There is some expensive management techniques that might be used to limit your risks.  The risks for ORN either from infection or spontaneous ( or on it's own) are life long.    TRISMUS  Trismus is a condition where the jaw does not allow the mouth to open as wide as it usually does.  This can happen almost suddenly, or in other cases the process is so slow, it is hard to notice it-until it is too far along.  When the jaw joints and/or muscles have been exposed to radiation treatments, the onset of Trismus is very slow.  This is because the muscles are losing their stretching ability over a long period of time, as long as 2 YEARS after the end of radiation.  It is therefore important to exercise these muscles and joints.  TRISMUS EXERCISES   Stack of tongue depressors measuring the same or a little less than the last documented MIO (Maximum Interincisal Opening).  Secure them with a rubber band on both ends.  Place the stack in the patient's mouth, supporting the other end.  Allow 30 seconds for muscle stretching.  Rest for a few seconds.  Repeat 3-5 times  For all radiation patients, this exercise is recommended in the mornings and evenings unless otherwise instructed.  The exercise should be done for  a period of 2 YEARS after the end of radiation.  MIO should be checked routinely on recall dental visits by the general dentist or the hospital dentist.  The patient is advised to report any changes, soreness, or difficulties encountered when doing the exercises.  FLUORIDE TRAYS PATIENT INSTRUCTIONS    Obtain prescription from the pharmacy.  Don't be surprised if it needs to be ordered.   Be sure to let the pharmacy know when you are close to needing a new refill for them to have it ready for you without interruption of Fluoride use.   The best time to use your Fluoride is before bed time.   You must brush your teeth very well and floss before using the Fluoride in order to get the best use out of the Fluoride treatments.   Place 1 drop of Fluoride gel per tooth in the tray.   Place the tray on your lower teeth and/or your upper teeth.  Make sure the trays are seated all the way.  Remember, they only fit one way on your teeth.   Insert for 5 full minutes.   At the end of the 5 minutes, take the trays out.  SPIT OUT excess. .    Do NOT rinse your mouth!    Do NOT eat or drink after treatments for at least 30 minutes.  This is why the best time for your treatments is before bedtime.    Clean the inside of your Fluoride trays using COLD WATER and a toothbrush.    In order to keep your Trays from discoloring and free from odors, soak them overnight in denture cleaners such as Efferdent.  Do not use bleach or non denture products.    Store the trays   in a safe dry place AWAY from any heat until your next treatment.    Bring the trays with you for your next dental check-up.  The dentist will confirm their fit.    If anything happens to your Fluoride trays, or they don't fit as well after any dental work, please let us know as soon as possible. 

## 2013-01-09 NOTE — Progress Notes (Signed)
DENTAL CONSULTATION  Date of Consultation:  01/09/2013 Patient Name:   Brent [name] Date of Birth:   1951/04/03 Medical Record Number: [id number]  VITALS: BP 109/69  Pulse 65  Temp(Src) 97.5 F (36.4 C) (Oral)   CHIEF COMPLAINT: Patient referred by Dr. Lazarus Salines for a preradiation therapy dental protocol consultation.  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 Patient with anticipated hemiglossectomy surgery with Dr. Lazarus Salines most likely followed by radiation therapy and possible chemotherapy. Patient is now seen as part of a prechemoradiation therapy dental protocol consultation.  The patient currently denies acute toothache, swellings, or abscesses. Patient was last seen approximately one and a half years ago for an examination by Dr. Robby Sermon (dentist).  Patient was then referred to Dr. Hewitt Blade (Oral surgeon) for a dental extraction. Patient denies having any complications associated with that dental extraction. Patient then developed a tongue ulceration and was recently seen by Dr. Hewitt Blade. Dr. Warren Danes then referred the patient to Dr. 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  Patient doesnot seek regular dental care. Patient has not had a dental cleaning in a " long time".   Patient Active Problem List   Diagnosis Date Noted  . Squamous cell carcinoma of the left lateral tongue 01/09/2013    Priority: High    PMH: Past Medical History  Diagnosis Date  . Arthritis   . Hypertension   . Tongue cancer     PSH: Past Surgical History  Procedure Laterality Date  . Knee surgery Left   . Above knee leg amputation Left 2006    Dr. Renae Fickle    ALLERGIES: No Known Allergies  MEDICATIONS: Current Outpatient Prescriptions  Medication Sig Dispense Refill  . cyclobenzaprine (FLEXERIL) 10 MG tablet Take 10 mg by  mouth 3 (three) times daily as needed for muscle spasms.      Marland Kitchen ibuprofen (ADVIL,MOTRIN) 200 MG tablet Take 400 mg by mouth every 8 (eight) hours as needed for pain. Take Q8 hrs prn      . lisinopril (PRINIVIL,ZESTRIL) 40 MG tablet 20 mg daily.       Marland Kitchen morphine (MS CONTIN) 100 MG 12 hr tablet 100 mg 2 (two) times daily.       Marland Kitchen oxycodone (ROXICODONE) 30 MG immediate release tablet Take 30 mg by mouth every 6 (six) hours as needed for pain. Take 1-3 tabs q 6 hrs prn pain      . simvastatin (ZOCOR) 20 MG tablet 10 mg daily.       . sodium fluoride (FLUORISHIELD) 1.1 % GEL dental gel Instill one drop of fluoride per tooth space of fluoride tray. Place over teeth for 5 minutes. Remove. Spit out excess. Repeat nightly.  120 mL  prn   No current facility-administered medications for this visit.     LABS: No results found for this basename: WBC, HGB, HCT, MCV, PLT   No results found for this basename: na, k, cl, co2, glucose, bun, creatinine, calcium, gfrnonaa, gfraa   No results found for this basename: INR, PROTIME   No results found for this basename: PTT    SOCIAL HISTORY: History   Social History  . Marital Status: Married    Spouse Name: N/A    Number of Children: N/A  . Years of Education: N/A   Occupational History  . Not on file.   Social  History Main Topics  . Smoking status: Current Every Day Smoker -- 1.00 packs/day for 30 years  . Smokeless tobacco: Never Used  . Alcohol Use: Yes     Comment: one beer or bourbon drink per week  . Drug Use: No  . Sexually Active: Not on file   Other Topics Concern  . Not on file   Social History Narrative  . No narrative on file    FAMILY HISTORY: Family History  Problem Relation Age of Onset  . Prostate cancer    . Asthma Son   . Arthritis/Rheumatoid Mother   . Colon cancer Father      REVIEW OF SYSTEMS: Reviewed with patient and included in dental record.   DENTAL HISTORY: CHIEF COMPLAINT: Patient referred by Dr.  Lazarus Salines for a preradiation therapy dental protocol consultation.  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 Patient with anticipated hemiglossectomy surgery with Dr. Lazarus Salines most likely followed by radiation therapy and possible chemotherapy. Patient is now seen as part of a prechemoradiation therapy dental protocol consultation.  The patient currently denies acute toothache, swellings, or abscesses. Patient was last seen approximately one and a half years ago for an examination by Dr. Robby Sermon (dentist).  Patient was then referred to Dr. Hewitt Blade (Oral surgeon) for a dental extraction. Patient denies having any complications associated with that dental extraction. Patient then developed a tongue ulceration and was recently seen by Dr. Hewitt Blade. Dr. Warren Danes then referred the patient to Dr. 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  Patient doesnot seek regular dental care. Patient has not had a dental cleaning in a " long time".    DENTAL EXAMINATION:  GENERAL: Patient is a well-developed, slightly built male in no acute distress. Patient has an above-knee amputation and walks with the aid of crutches. The patient did not wear his prosthesis today. HEAD AND NECK: There is no palpable submandibular lymphadenopathy. The patient denies acute TMJ symptoms. INTRAORAL EXAM: Patient has normal saliva. Patient has an extensive left lateral tongue ulceration consistent with the cancer diagnosis. DENTITION: Patient is missing tooth numbers 1, 15, 16, 17, and 32.   PERIODONTAL: Patient has chronic periodontitis with plaque and calculus accumulations, selective areas of gingival recession, and incipient mandibular anterior tooth mobility. DENTAL CARIES/SUBOPTIMAL RESTORATIONS: There are no obvious dental caries noted. Patient has multiple  abfraction lesions. Patient has incisal attrition affecting tooth numbers 6 through 10 and 22 through 27. ENDODONTIC: The patient currently denies acute pulpitis symptoms. I do not see any evidence of periapical pathology or radiolucency. CROWN AND BRIDGE: There are no crown restorations noted. PROSTHODONTIC: The patient has no partial dentures. OCCLUSION: The patient has a poor occlusal scheme secondary to multiple missing teeth, multiple diastemas, multiple areas of incisal attrition, and lack of replacement of all missing teeth with dental prostheses.  RADIOGRAPHIC INTERPRETATION: A panoramic obtained and supplemented with a full series of dental radiographs. There are multiple missing teeth numbers 1, 15, 16, 17, and 32. There is incipient to moderate bone loss noted. There is radiographic calculus noted.  There is supra-eruption and drifting of the unopposed teeth into the edentulous areas. There multiple diastemas noted. Multiple resin restorations are noted. No obvious periapical pathology is noted.   ASSESSMENTS: 1. Chronic periodontitis with bone loss 2. Plaque and calculus accumulations 3. Gingival recession 4. Incipient mandibular anterior tooth mobility  5. Multiple areas of facial flexure/abfraction lesions 6. Maxillary and mandibular anterior incisal attrition 7. Multiple missing teeth 8. Multiple diastemas 9. Incisal attrition 10. Poor occlusal scheme and malocclusion   PLAN/RECOMMENDATIONS: 1. I discussed the risks, benefits, and complications of various treatment options with the patient in relationship to his medical and dental conditions, possible chemoradiation therapy, and chemoradiation therapy side effects to include xerostomia, radiation caries, trismus, mucositis, taste changes, gum and jawbone changes, and risk for infection, bleeding, and osteoradionecrosis.  We discussed various treatment options to include no treatment,  extraction of teeth in the primary field  radiation therapy, alveoloplasty, pre-prosthetic surgery as indicated, periodontal therapy, dental restorations, root canal therapy, crown and bridge therapy, implant therapy, and replacement of missing teeth as indicated. We also discussed fabrication of fluoride trays and a radiation cone locator/tongue positioner .  The patient currently is adamant about not having dental extractions at this time. Patient expresses understanding with the potential risk for osteoradionecrosis in the future with radiation therapy. The patient does agree to proceed with impressions today for the fabrication of the fluoride trays and tongue positioner as needed for the radiation therapy. He also agrees to be referred back to Dr. Robby Sermon for initial periodontal therapy or possible referral to periodontist for initial periodontal therapy prior to anticipated radiation therapy. A prescription for fluoriSHIELD fluoride therapy will be faxed to the [facility] on [facility].   2. Discussion of findings with medical team and coordination of future medical and dental care as indicated.   Charlynne Pander, DDS

## 2013-01-10 ENCOUNTER — Encounter: Payer: Self-pay | Admitting: Oncology

## 2013-01-10 ENCOUNTER — Ambulatory Visit (HOSPITAL_BASED_OUTPATIENT_CLINIC_OR_DEPARTMENT_OTHER): Payer: Medicare Other | Admitting: Oncology

## 2013-01-10 ENCOUNTER — Ambulatory Visit: Payer: Medicare Other

## 2013-01-10 ENCOUNTER — Telehealth: Payer: Self-pay | Admitting: Oncology

## 2013-01-10 ENCOUNTER — Other Ambulatory Visit (HOSPITAL_BASED_OUTPATIENT_CLINIC_OR_DEPARTMENT_OTHER): Payer: Medicare Other | Admitting: Lab

## 2013-01-10 VITALS — BP 109/63 | HR 69 | Temp 98.3°F | Resp 18 | Ht 68.0 in | Wt 127.6 lb

## 2013-01-10 DIAGNOSIS — C029 Malignant neoplasm of tongue, unspecified: Secondary | ICD-10-CM

## 2013-01-10 LAB — CBC WITH DIFFERENTIAL/PLATELET
BASO%: 1.1 % (ref 0.0–2.0)
Eosinophils Absolute: 0.3 10*3/uL (ref 0.0–0.5)
HCT: 32.2 % — ABNORMAL LOW (ref 38.4–49.9)
LYMPH%: 17.9 % (ref 14.0–49.0)
MONO#: 0.4 10*3/uL (ref 0.1–0.9)
NEUT#: 4.1 10*3/uL (ref 1.5–6.5)
NEUT%: 68.5 % (ref 39.0–75.0)
Platelets: 163 10*3/uL (ref 140–400)
WBC: 6 10*3/uL (ref 4.0–10.3)
lymph#: 1.1 10*3/uL (ref 0.9–3.3)

## 2013-01-10 LAB — COMPREHENSIVE METABOLIC PANEL (CC13)
ALT: 40 U/L (ref 0–55)
CO2: 25 mEq/L (ref 22–29)
Calcium: 9.5 mg/dL (ref 8.4–10.4)
Chloride: 106 mEq/L (ref 98–107)
Creatinine: 0.9 mg/dL (ref 0.7–1.3)
Glucose: 113 mg/dl — ABNORMAL HIGH (ref 70–99)
Sodium: 141 mEq/L (ref 136–145)
Total Bilirubin: 0.33 mg/dL (ref 0.20–1.20)
Total Protein: 6.4 g/dL (ref 6.4–8.3)

## 2013-01-10 NOTE — Telephone Encounter (Signed)
gv and printed appt sched and avs for pt  °

## 2013-01-10 NOTE — Progress Notes (Signed)
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   HISTORY OF PRESENT ILLNESS::Brent [name] is a 62 y.o. male who is seen for an initial consultation visit. The patient indicates that he has had some changes for approximately one year in which he would bite his tongue. This would always he'll however and was occasional in nature and therefore he did not seek medical attention for this. He thought that there was just some irritation. However, the patient noticed a more persistent soreness over the last month which has been getting worse. This causes some pain when he chews. He denies any difficulty swallowing or other discomfort in the head and neck region.  The patient has lost approximately 20 pounds. He has not noticed any neck masses.  The patient therefore was seen by an oral surgeon who noted an ulcer on his tongue laterally on the left.  A biopsy was performed and this returned positive for invasive squamous cell carcinoma. The tumor was negative for PE 16 staining.  Additional workup has included a PET scan. This showed focal hypermetabolic activity in the left lateral tongue which was consistent with carcinoma at this location. There is no evidence of hyper metabolic metastatic disease elsewhere within the neck or distantly.  The patient has been seen by Dr. Lazarus Salines. No other lesions found within the head and neck region on exam. He has discussed possible surgery with the patient and additional clinic appointments have been made including seeing medical oncology tomorrow and dentistry later today.   PREVIOUS RADIATION THERAPY: No   PAST MEDICAL HISTORY:  has a past  medical history of Arthritis; Hypertension; and Tongue cancer.     PAST SURGICAL HISTORY: Past Surgical History  Procedure Laterality Date  . Knee surgery Left   . Above knee leg amputation Left 2006    Dr. Renae Fickle     FAMILY HISTORY: family history includes Arthritis/Rheumatoid in his mother; Asthma in his son; Colon cancer in his father; and Prostate cancer in an unspecified family member.   SOCIAL HISTORY:  reports that he has been smoking.  He has never used smokeless tobacco. He reports that  drinks alcohol. He reports that he does not use illicit drugs.   ALLERGIES: Review of patient's allergies indicates no known allergies.   MEDICATIONS:  Current Outpatient Prescriptions  Medication Sig Dispense Refill  . cyclobenzaprine (FLEXERIL) 10 MG tablet Take 10 mg by mouth 3 (three) times daily as needed for muscle spasms.      Marland Kitchen ibuprofen (ADVIL,MOTRIN) 200 MG tablet Take 400 mg by mouth every 8 (eight) hours as needed for pain. Take Q8 hrs prn      . lisinopril (PRINIVIL,ZESTRIL) 40 MG tablet 20 mg daily.       Marland Kitchen morphine (MS CONTIN) 100 MG 12 hr tablet 100 mg 2 (two) times daily.       Marland Kitchen oxycodone (ROXICODONE) 30 MG immediate release tablet Take 30 mg by mouth every 6 (six) hours as needed for pain. Take 1-3 tabs q 6 hrs prn pain      . simvastatin (ZOCOR) 20 MG tablet 10 mg daily.       . sodium  fluoride (FLUORISHIELD) 1.1 % GEL dental gel Instill one drop of fluoride per tooth space of fluoride tray. Place over teeth for 5 minutes. Remove. Spit out excess. Repeat nightly.  120 mL  prn   No current facility-administered medications for this encounter.     REVIEW OF SYSTEMS:  A 15 point review of systems is documented in the electronic medical record. This was obtained by the nursing staff. However, I reviewed this with the patient to discuss relevant findings and make appropriate changes.  Pertinent items are noted in HPI.    PHYSICAL EXAM:  height is 5\' 8"  (1.727 m) and weight  is 126 lb 14.4 oz (57.561 kg). His temperature is 98.8 F (37.1 C). His blood pressure is 132/79 and his pulse is 73.   General: Well-developed, in no acute distress HEENT: Normocephalic, atraumatic; oral cavity demonstrates a 4-5 cm lesion with some ulceration more centrally. The tumor is associated with some pallor and firmness on exam. The tumor does not approach/cross midline and does not extend to the base of tongue region. No extension to surrounding structures. Remainder of the oral cavity/oropharynx clear. Neck: Supple without any lymphadenopathy Cardiovascular: Regular rate and rhythm Respiratory: Clear to auscultation bilaterally GI: Soft, nontender, normal bowel sounds Extremities: No edema present Neuro: No focal deficits     LABORATORY DATA:  No results found for this basename: WBC, HGB, HCT, MCV, PLT   No results found for this basename: NA, K, CL, CO2   No results found for this basename: ALT, AST, GGT, ALKPHOS, BILITOT      RADIOGRAPHY: Ct Soft Tissue Neck W Contrast  01/09/2013   *RADIOLOGY REPORT*  Clinical Data: 62 year old male new diagnosis tongue cancer, mouth cancer.  CT NECK WITH CONTRAST  Technique:  Multidetector CT imaging of the neck was performed with intravenous contrast.  Contrast:  75 ml Omnipaque-300.  Comparison: Chest CT from the same day reported separately.  PET-CT 01/06/2013.  Findings: Chest findings are reported separately.  The patient has little subcutaneous fat in the neck.  Asymmetric density associated with the left deep lobe of the parotid gland appears to be venous vascular related.  Parapharyngeal and retropharyngeal spaces are within normal limits.  Submandibular and parotid glands are within normal limits. Visualized orbit soft tissues are within normal limits.  Grossly negative visualized brain parenchyma.  Major vascular structures in the neck and at the skull base are patent.  There is bilateral carotid bifurcation calcified  atherosclerosis.  There is a subtle area of asymmetric density associated with the left lateral intrinsic muscles of the tongue which correlates to the area of abnormal PET activity.  See series 5 image 34.  The lesion encompasses 28 x 10 x 17 mm (AP by transverse by CC), but the CC measurement is only approximate due to dental artifact.  The base of tongue is not definitely affected.  The nasopharynx, oropharynx and hypopharynx appear within normal limits.  The larynx is within normal limits.  No level I lymphadenopathy is identified.  No enlarged level II lymph node is identified.  The bilateral level II nodes are not well delineated in part due to the paucity of fat.  Overall, there is no definite cervical lymphadenopathy.  The left mandible appears intact and without evidence of bony destruction or invasion.  Degenerative changes in the cervical spine. No acute osseous abnormality identified.  Chronic left lamina papyracea fracture.  Minimal right ethmoid sinus mucosal thickening.  Other Visualized paranasal sinuses and mastoids are clear.  IMPRESSION: 1.  Subtle hyperenhancing left lateral oral tongue/intrinsic tongue muscle lesion measuring roughly 28 x 10 x 17 mm.  No adjacent osseous involvement. 2.  No associated lymphadenopathy is identified.  But all cervical lymph nodes are difficult to delineate due to a paucity of fat in the neck. 3.  Chest findings reported separately.   Original Report Authenticated By: Erskine Speed, M.D.   Ct Chest W Contrast  01/09/2013   *RADIOLOGY REPORT*  Clinical Data: Tongue cancer.  Evaluate for distant metastases.  CT CHEST WITH CONTRAST  Technique:  Multidetector CT imaging of the chest was performed following the standard protocol during bolus administration of intravenous contrast.  Contrast: 75mL OMNIPAQUE IOHEXOL 300 MG/ML  SOLN  Comparison: 03/31/2005  Findings: Lungs are clear.  No focal airspace opacities or suspicious nodules.  No effusions.  Mild COPD changes in  the apices.  Heart is normal size. Aorta is normal caliber.  No mediastinal, hilar, or axillary adenopathy.  Visualized thyroid and chest wall soft tissues unremarkable. Imaging into the upper abdomen shows no acute findings.  No acute bony abnormality.  IMPRESSION: No acute findings within the chest.  Mild COPD.   Original Report Authenticated By: Charlett Nose, M.D.   Nm Pet Image Restag (ps) Skull Base To Thigh  01/06/2013   *RADIOLOGY REPORT*  Clinical Data: Subsequent treatment strategy for tongue carcinoma.  NUCLEAR MEDICINE PET SKULL BASE TO THIGH  Fasting Blood Glucose:  105  Technique:  18.9 mCi F-18 FDG was injected intravenously. CT data was obtained and used for attenuation correction and anatomic localization only.  (This was not acquired as a diagnostic CT examination.) Additional exam technical data entered on technologist worksheet.  Comparison:  None  Findings:  Neck: Focal area of hypermetabolic activity is seen in the left lateral tongue which has a maximum SUV of 14.9.  Residual or recurrent carcinoma at this site cannot be excluded.  No hypermetabolic lymph nodes are seen within the neck.  Diffusely increased hypermetabolic activity is seen throughout both parotid glands, consistent with parotitis.  Chest:  No hypermetabolic mediastinal or hilar nodes.  No suspicious pulmonary nodules on the CT scan.  Abdomen/Pelvis:  No abnormal hypermetabolic activity within the liver, pancreas, adrenal glands, or spleen.  No hypermetabolic lymph nodes in the abdomen or pelvis.  Skeleton:  No focal hypermetabolic activity to suggest skeletal metastasis.  IMPRESSION:  1.  Focal hypermetabolic activity in the left lateral tongue; residual or locally recurrent carcinoma at this site cannot be excluded. 2.  No evidence of metastatic disease elsewhere within the neck, chest, abdomen, or pelvis.   Original Report Authenticated By: Myles Rosenthal, M.D.       IMPRESSION: The patient has a new diagnosis of squamous  cell carcinoma of the left lateral tongue. Clinically he is presenting with a T3N0M0 tumor. The size of the tumor suggests a T3 tumor slightly larger than the cutoff of T2/3.   The patient has been seen by Dr. Lazarus Salines who has discussed surgical resection/lymph node dissection and I believe that this would be appropriate initial management. I discussed with the patient that the decision to proceed with additional treatment would be made on the basis of the results from surgery. From his workup thus far and from exam today, I believe that it is likely that the patient will have adverse features sufficiently suggestive of a substantial risk for local/regional recurrence where postoperative radiotherapy would be recommended. The patient is also seeing medical oncology tomorrow and a  decision regarding systemic treatment can also be made on this basis.  I discussed with the patient therefore a potential postoperative ~6 week course of radiotherapy. We discussed some of the details of such a treatment. We also discussed the potential side effects and risks. All of his questions were answered.  I did discuss with the patient as well the issue of possible feeding tube. We discussed the pros and times of pretreatment placement of this versus potentially needing to do this later during treatment. The patient understandably would like to avoid this if at all possible. He suggested that he would potentially like to hold off on this and have this performed if needed but he is going to further think about this and discuss this with other physicians.   PLAN:  -Further multidisciplinary evaluation. I believe that proceeding with surgical resection is reasonable as initial management -Medical oncology appointment tomorrow -The patient is seeing Dr. Kristin Bruins today in dentistry and I will coordinate care with him given what appears to be the likely need for postoperative radiation. -I will schedule the patient a followup  2-3 weeks after surgery. We will review his operative results at that time and coordinate further care. We also discussed a referral to speech/swallowing therapy as well which could be made at that time. -The patient is being scheduled to see the nutritionist.    I spent 60 minutes minutes face to face with the patient and more than 50% of that time was spent in counseling and/or coordination of care.    ________________________________   Radene Gunning, MD, PhD

## 2013-01-10 NOTE — Progress Notes (Signed)
No financial issues. checked in new patient. All communication via mail and phone. He has POA/living will but not with him today.

## 2013-01-10 NOTE — Progress Notes (Signed)
[facility]  Telephone:([phone]) [phone] Fax:([phone]) [phone]   MEDICAL ONCOLOGY - INITIAL CONSULATION    Referral MD: [name], M.D.   Reason for Referral: newly diagnosed left anterior tongue SCC.   HPI:  Brent [name] is a 62 year-old man with history of smoking.  He has for about 1 year hx of sore left side of the tongue. It was thought to be due to irritation from biting. It used to self-resolved. He was previously instructed to follow up with oral surgeon for this but was lost to follow up. He then developed one month history of left anterior tongue ulcer. He was referred to ENT with biopsy consistent with SCC.  He was kindly referred to the [facility] for evaluation.  Brent [name] presented to the clinic for the first time today with his wife.  He noticed for the past week, the ulcer has grown in size more rapidly than 1 month ago.  He has mild pain.  He has good appetite and has not noticed weight loss.  He denied any palpable neck node.  He denies fever, fatigue, headache, visual changes, confusion, drenching night sweats, palpable lymph node swelling, odynophagia, dysphagia, nausea vomiting, jaundice, chest pain, palpitation, shortness of breath, dyspnea on exertion, productive cough, gum bleeding, epistaxis, hematemesis, hemoptysis, abdominal pain, abdominal swelling, early satiety, melena, hematochezia, hematuria, skin rash, spontaneous bleeding, joint swelling, joint pain, heat or cold intolerance, bowel bladder incontinence, back pain, focal motor weakness, paresthesia, depression.       Past Medical History  Diagnosis Date  . Arthritis   . Hypertension   . Tongue cancer   . DVT (deep venous thrombosis) 2006    associated with surgery  . PE (pulmonary embolism) 2006    associated with surgery  . Diverticulosis   :  Past Surgical History  Procedure Laterality Date  . Knee surgery Left   . Above knee leg amputation Left 2006    Dr. Renae Fickle  .  Colonoscopy  2011  :  Current Outpatient Prescriptions  Medication Sig Dispense Refill  . cyclobenzaprine (FLEXERIL) 10 MG tablet Take 10 mg by mouth 3 (three) times daily as needed for muscle spasms.      Marland Kitchen ibuprofen (ADVIL,MOTRIN) 200 MG tablet Take 400 mg by mouth every 8 (eight) hours as needed for pain. Take Q8 hrs prn      . lisinopril (PRINIVIL,ZESTRIL) 20 MG tablet Take 20 mg by mouth daily.      Marland Kitchen morphine (MS CONTIN) 100 MG 12 hr tablet 100 mg 2 (two) times daily.       Marland Kitchen oxycodone (ROXICODONE) 30 MG immediate release tablet Take 30 mg by mouth every 6 (six) hours as needed for pain. Take 1-3 tabs q 6 hrs prn pain      . simvastatin (ZOCOR) 10 MG tablet Take 10 mg by mouth at bedtime.       No current facility-administered medications for this visit.     No Known Allergies:  Family History  Problem Relation Age of Onset  . Prostate cancer    . Asthma Son   . Arthritis/Rheumatoid Mother   . Colon cancer Father 30  :  History   Social History  . Marital Status: Married    Spouse Name: N/A    Number of Children: 3  . Years of Education: N/A   Occupational History  .      retired post Film/video editor   Social History Main Topics  .  Smoking status: Former Smoker -- 1.00 packs/day for 30 years    Quit date: 12/22/2012  . Smokeless tobacco: Never Used  . Alcohol Use: Yes     Comment: one beer or bourbon drink per week  . Drug Use: No  . Sexually Active: Not on file   Other Topics Concern  . Not on file   Social History Narrative  . No narrative on file  :   Exam: ECOG 0.   General:  well-nourished in no acute distress.  Eyes:  no scleral icterus.  ENT:  There was a 3x3cm left anterior tongue ulcerated mass without foul order or bleeding.   Neck was without thyromegaly.  Lymphatics:  Negative cervical, supraclavicular or axillary adenopathy.  Respiratory: lungs were clear bilaterally without wheezing or crackles.  Cardiovascular:  Regular rate and rhythm,  S1/S2, without murmur, rub or gallop.  There was no pedal edema.  GI:  abdomen was soft, flat, nontender, nondistended, without organomegaly.  Muscoloskeletal:  no spinal tenderness of palpation of vertebral spine.  His left leg was amputated above the knee. Skin exam was without echymosis, petichae.  Neuro exam was nonfocal.  Patient was able to get on and off exam table without assistance.  Gait was normal.  Patient was alert and oriented.  Attention was good.   Language was appropriate.  Mood was normal without depression.  Speech was not pressured.  Thought content was not tangential.     Lab Results  Component Value Date   WBC 6.0 01/10/2013   HGB 11.1* 01/10/2013   HCT 32.2* 01/10/2013   PLT 163 01/10/2013   Imaging:  I personally reviewed the following scan and showed the images to the patient and his wife.   Ct Soft Tissue Neck W Contrast  01/09/2013   *RADIOLOGY REPORT*  Clinical Data: 62 year old male new diagnosis tongue cancer, mouth cancer.  CT NECK WITH CONTRAST  Technique:  Multidetector CT imaging of the neck was performed with intravenous contrast.  Contrast:  75 ml Omnipaque-300.  Comparison: Chest CT from the same day reported separately.  PET-CT 01/06/2013.  Findings: Chest findings are reported separately.  The patient has little subcutaneous fat in the neck.  Asymmetric density associated with the left deep lobe of the parotid gland appears to be venous vascular related.  Parapharyngeal and retropharyngeal spaces are within normal limits.  Submandibular and parotid glands are within normal limits. Visualized orbit soft tissues are within normal limits.  Grossly negative visualized brain parenchyma.  Major vascular structures in the neck and at the skull base are patent.  There is bilateral carotid bifurcation calcified atherosclerosis.  There is a subtle area of asymmetric density associated with the left lateral intrinsic muscles of the tongue which correlates to the area of abnormal  PET activity.  See series 5 image 34.  The lesion encompasses 28 x 10 x 17 mm (AP by transverse by CC), but the CC measurement is only approximate due to dental artifact.  The base of tongue is not definitely affected.  The nasopharynx, oropharynx and hypopharynx appear within normal limits.  The larynx is within normal limits.  No level I lymphadenopathy is identified.  No enlarged level II lymph node is identified.  The bilateral level II nodes are not well delineated in part due to the paucity of fat.  Overall, there is no definite cervical lymphadenopathy.  The left mandible appears intact and without evidence of bony destruction or invasion.  Degenerative changes in the cervical spine.  No acute osseous abnormality identified.  Chronic left lamina papyracea fracture.  Minimal right ethmoid sinus mucosal thickening.  Other Visualized paranasal sinuses and mastoids are clear.  IMPRESSION: 1.  Subtle hyperenhancing left lateral oral tongue/intrinsic tongue muscle lesion measuring roughly 28 x 10 x 17 mm.  No adjacent osseous involvement. 2.  No associated lymphadenopathy is identified.  But all cervical lymph nodes are difficult to delineate due to a paucity of fat in the neck. 3.  Chest findings reported separately.   Original Report Authenticated By: Erskine Speed, M.D.    Nm Pet Image Restag (ps) Skull Base To Thigh  01/06/2013   *RADIOLOGY REPORT*  Clinical Data: Subsequent treatment strategy for tongue carcinoma.  NUCLEAR MEDICINE PET SKULL BASE TO THIGH  Fasting Blood Glucose:  105  Technique:  18.9 mCi F-18 FDG was injected intravenously. CT data was obtained and used for attenuation correction and anatomic localization only.  (This was not acquired as a diagnostic CT examination.) Additional exam technical data entered on technologist worksheet.  Comparison:  None  Findings:  Neck: Focal area of hypermetabolic activity is seen in the left lateral tongue which has a maximum SUV of 14.9.  Residual or  recurrent carcinoma at this site cannot be excluded.  No hypermetabolic lymph nodes are seen within the neck.  Diffusely increased hypermetabolic activity is seen throughout both parotid glands, consistent with parotitis.  Chest:  No hypermetabolic mediastinal or hilar nodes.  No suspicious pulmonary nodules on the CT scan.  Abdomen/Pelvis:  No abnormal hypermetabolic activity within the liver, pancreas, adrenal glands, or spleen.  No hypermetabolic lymph nodes in the abdomen or pelvis.  Skeleton:  No focal hypermetabolic activity to suggest skeletal metastasis.  IMPRESSION:  1.  Focal hypermetabolic activity in the left lateral tongue; residual or locally recurrent carcinoma at this site cannot be excluded. 2.  No evidence of metastatic disease elsewhere within the neck, chest, abdomen, or pelvis.   Original Report Authenticated By: Myles Rosenthal, M.D.    Assessment and Plan:   1.  History of smoking:  None at this time.  2.  History of left leg infection, DVT, amputation:  He will need to be on DVT prophylaxis during hospital admission to prevent recurrent DVT.   3.  Newly diagnosed left anterior tongue SCC: - Most likely localized per work up so far. - I recommended definitive oncologic resection with hemiglossectomy with left neck dissection. - If he has high risk features on final path, then he may benefit from adjuvant radiatio and possibly also chemotherapy. - Patient and his wife expressed informed understanding and wished to proceed with upfront resection.  I personally contacted Dr. Lazarus Salines and relayed this recommendation.  Dr. Lazarus Salines graciously agreed to post the case to OR as soon as possible.  - Follow up:  In about 3-4 weeks to discuss pathology result and need for adjuvant chemoradiation.    The length of time of the face-to-face encounter was 30 minutes. More than 50% of time was spent counseling and coordination of care.     Thank you for this referral.

## 2013-01-13 ENCOUNTER — Encounter (HOSPITAL_COMMUNITY): Payer: Self-pay | Admitting: Respiratory Therapy

## 2013-01-18 ENCOUNTER — Other Ambulatory Visit: Payer: Self-pay | Admitting: Otolaryngology
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
## 2013-01-20 ENCOUNTER — Encounter (HOSPITAL_COMMUNITY): Payer: Self-pay

## 2013-01-20 ENCOUNTER — Ambulatory Visit (HOSPITAL_COMMUNITY)
Admission: RE | Admit: 2013-01-20 | Discharge: 2013-01-20 | Disposition: A | Discharge status: Home / Self Care | Payer: Medicare Other | Source: Ambulatory Visit | Attending: Otolaryngology | Admitting: Otolaryngology

## 2013-01-20 ENCOUNTER — Encounter (HOSPITAL_COMMUNITY)
Admission: RE | Admit: 2013-01-20 | Discharge: 2013-01-20 | Disposition: A | Discharge status: Home / Self Care | Payer: Medicare Other | Source: Ambulatory Visit | Attending: Otolaryngology | Admitting: Otolaryngology

## 2013-01-20 DIAGNOSIS — Z01818 Encounter for other preprocedural examination: Secondary | ICD-10-CM | POA: Insufficient documentation

## 2013-01-20 DIAGNOSIS — Z01812 Encounter for preprocedural laboratory examination: Secondary | ICD-10-CM | POA: Insufficient documentation

## 2013-01-20 DIAGNOSIS — C029 Malignant neoplasm of tongue, unspecified: Secondary | ICD-10-CM | POA: Insufficient documentation

## 2013-01-20 DIAGNOSIS — Z01810 Encounter for preprocedural cardiovascular examination: Secondary | ICD-10-CM | POA: Insufficient documentation

## 2013-01-20 LAB — CBC
HCT: 30.3 % — ABNORMAL LOW (ref 39.0–52.0)
MCH: 31.4 pg (ref 26.0–34.0)
MCV: 92.4 fL (ref 78.0–100.0)
RBC: 3.28 MIL/uL — ABNORMAL LOW (ref 4.22–5.81)
RDW: 12.8 % (ref 11.5–15.5)
WBC: 6.1 10*3/uL (ref 4.0–10.5)

## 2013-01-20 LAB — BASIC METABOLIC PANEL
CO2: 29 mEq/L (ref 19–32)
Chloride: 102 mEq/L (ref 96–112)
Creatinine, Ser: 0.85 mg/dL (ref 0.50–1.35)
Glucose, Bld: 106 mg/dL — ABNORMAL HIGH (ref 70–99)

## 2013-01-20 NOTE — Pre-Procedure Instructions (Signed)
Brent [name]  01/20/2013   Your procedure is scheduled on:  01/23/13  Report to [facility] at 530 AM.  Call this number if you have problems the morning of surgery: [phone]   Remember:   Do not eat food or drink liquids after midnight.   Take these medicines the morning of surgery with A SIP OF WATER: pain med.,flexeril   Do not wear jewelry, make-up or nail polish.  Do not wear lotions, powders, or perfumes. You may wear deodorant.  Do not shave 48 hours prior to surgery. Men may shave face and neck.  Do not bring valuables to the hospital.  [facility] is not responsible                   for any belongings or valuables.  Contacts, dentures or bridgework may not be worn into surgery.  Leave suitcase in the car. After surgery it may be brought to your room.  For patients admitted to the hospital, checkout time is 11:00 AM the day of  discharge.   Patients discharged the day of surgery will not be allowed to drive  home.  Name and phone number of your driver: family  Special Instructions: Shower using CHG 2 nights before surgery and the night before surgery.  If you shower the day of surgery use CHG.  Use special wash - you have one bottle of CHG for all showers.  You should use approximately 1/3 of the bottle for each shower.   Please read over the following fact sheets that you were given: Pain Booklet, Coughing and Deep Breathing and Surgical Site Infection Prevention

## 2013-01-22 ENCOUNTER — Other Ambulatory Visit: Payer: Self-pay | Admitting: Oncology

## 2013-01-22 MED ORDER — CEFAZOLIN SODIUM-DEXTROSE 2-3 GM-% IV SOLR
2.0000 g | Freq: Once | INTRAVENOUS | Status: AC
  Administered 2013-01-23: 2 g via INTRAVENOUS
  Filled 2013-01-22 (×2): qty 50

## 2013-01-23 ENCOUNTER — Encounter (HOSPITAL_COMMUNITY): Payer: Self-pay | Admitting: Anesthesiology

## 2013-01-23 ENCOUNTER — Encounter (HOSPITAL_COMMUNITY)
Admission: RE | Disposition: A | Discharge status: Home / Self Care | Payer: Self-pay | Source: Ambulatory Visit | Attending: Otolaryngology

## 2013-01-23 ENCOUNTER — Other Ambulatory Visit: Payer: Self-pay | Admitting: Radiation Oncology

## 2013-01-23 ENCOUNTER — Encounter (HOSPITAL_COMMUNITY): Payer: Self-pay | Admitting: *Deleted

## 2013-01-23 ENCOUNTER — Inpatient Hospital Stay (HOSPITAL_COMMUNITY): Payer: Medicare Other | Admitting: Anesthesiology

## 2013-01-23 ENCOUNTER — Inpatient Hospital Stay (HOSPITAL_COMMUNITY)
Admission: RE | Admit: 2013-01-23 | Discharge: 2013-01-26 | DRG: 130 | Disposition: A | Discharge status: Home / Self Care | Payer: Medicare Other | Source: Ambulatory Visit | Attending: Otolaryngology | Admitting: Otolaryngology

## 2013-01-23 DIAGNOSIS — M129 Arthropathy, unspecified: Secondary | ICD-10-CM | POA: Diagnosis present

## 2013-01-23 DIAGNOSIS — F1920 Other psychoactive substance dependence, uncomplicated: Secondary | ICD-10-CM | POA: Diagnosis present

## 2013-01-23 DIAGNOSIS — K5730 Diverticulosis of large intestine without perforation or abscess without bleeding: Secondary | ICD-10-CM | POA: Diagnosis present

## 2013-01-23 DIAGNOSIS — Z86718 Personal history of other venous thrombosis and embolism: Secondary | ICD-10-CM

## 2013-01-23 DIAGNOSIS — I10 Essential (primary) hypertension: Secondary | ICD-10-CM | POA: Diagnosis present

## 2013-01-23 DIAGNOSIS — Z87891 Personal history of nicotine dependence: Secondary | ICD-10-CM

## 2013-01-23 DIAGNOSIS — C029 Malignant neoplasm of tongue, unspecified: Principal | ICD-10-CM | POA: Diagnosis present

## 2013-01-23 DIAGNOSIS — K5900 Constipation, unspecified: Secondary | ICD-10-CM | POA: Diagnosis present

## 2013-01-23 DIAGNOSIS — Z86711 Personal history of pulmonary embolism: Secondary | ICD-10-CM

## 2013-01-23 DIAGNOSIS — Q549 Hypospadias, unspecified: Secondary | ICD-10-CM

## 2013-01-23 DIAGNOSIS — S78119A Complete traumatic amputation at level between unspecified hip and knee, initial encounter: Secondary | ICD-10-CM

## 2013-01-23 HISTORY — PX: GLOSSECTOMY: SHX5200

## 2013-01-23 HISTORY — PX: ESOPHAGOSCOPY: SHX5534

## 2013-01-23 HISTORY — PX: DIRECT LARYNGOSCOPY: SHX5326

## 2013-01-23 HISTORY — PX: RADICAL NECK DISSECTION: SHX2284

## 2013-01-23 LAB — CBC
Hemoglobin: 9.1 g/dL — ABNORMAL LOW (ref 13.0–17.0)
MCH: 31.1 pg (ref 26.0–34.0)
MCV: 91.5 fL (ref 78.0–100.0)
RBC: 2.93 MIL/uL — ABNORMAL LOW (ref 4.22–5.81)

## 2013-01-23 SURGERY — LARYNGOSCOPY, DIRECT
Anesthesia: General | Site: Throat | Wound class: Clean Contaminated

## 2013-01-23 MED ORDER — MORPHINE SULFATE 2 MG/ML IJ SOLN
2.0000 mg | INTRAMUSCULAR | Status: DC | PRN
  Administered 2013-01-23 (×2): 2 mg via INTRAVENOUS
  Administered 2013-01-24 – 2013-01-25 (×6): 4 mg via INTRAVENOUS
  Administered 2013-01-25: 6 mg via INTRAVENOUS
  Administered 2013-01-25 (×2): 4 mg via INTRAVENOUS
  Administered 2013-01-26: 2 mg via INTRAVENOUS
  Filled 2013-01-23: qty 2
  Filled 2013-01-23: qty 3
  Filled 2013-01-23: qty 1
  Filled 2013-01-23: qty 2
  Filled 2013-01-23: qty 1
  Filled 2013-01-23 (×2): qty 2
  Filled 2013-01-23: qty 1
  Filled 2013-01-23 (×3): qty 2

## 2013-01-23 MED ORDER — CYCLOBENZAPRINE HCL 10 MG PO TABS
10.0000 mg | ORAL_TABLET | Freq: Three times a day (TID) | ORAL | Status: DC | PRN
  Administered 2013-01-24: 10 mg via ORAL
  Filled 2013-01-23 (×2): qty 1

## 2013-01-23 MED ORDER — HYDROMORPHONE HCL PF 1 MG/ML IJ SOLN
INTRAMUSCULAR | Status: AC
  Administered 2013-01-23: 1 mg
  Filled 2013-01-23: qty 1

## 2013-01-23 MED ORDER — LIDOCAINE-EPINEPHRINE 1 %-1:100000 IJ SOLN: INTRAMUSCULAR | Status: DC | PRN

## 2013-01-23 MED ORDER — HEPARIN SODIUM (PORCINE) 5000 UNIT/ML IJ SOLN
5000.0000 [IU] | Freq: Three times a day (TID) | INTRAMUSCULAR | Status: DC
  Administered 2013-01-23 – 2013-01-26 (×8): 5000 [IU] via SUBCUTANEOUS
  Filled 2013-01-23 (×12): qty 1

## 2013-01-23 MED ORDER — GLYCOPYRROLATE 0.2 MG/ML IJ SOLN
INTRAMUSCULAR | Status: DC | PRN
  Administered 2013-01-23: 0.2 mg via INTRAVENOUS
  Administered 2013-01-23: 0.6 mg via INTRAVENOUS

## 2013-01-23 MED ORDER — ALBUMIN HUMAN 5 % IV SOLN
INTRAVENOUS | Status: DC | PRN
  Administered 2013-01-23: 09:00:00 via INTRAVENOUS

## 2013-01-23 MED ORDER — ONDANSETRON HCL 4 MG/2ML IJ SOLN
4.0000 mg | INTRAMUSCULAR | Status: DC | PRN
  Filled 2013-01-23: qty 2

## 2013-01-23 MED ORDER — LIDOCAINE-EPINEPHRINE 1 %-1:100000 IJ SOLN
INTRAMUSCULAR | Status: AC
  Filled 2013-01-23: qty 1

## 2013-01-23 MED ORDER — ROCURONIUM BROMIDE 100 MG/10ML IV SOLN
INTRAVENOUS | Status: DC | PRN
  Administered 2013-01-23: 10 mg via INTRAVENOUS
  Administered 2013-01-23: 50 mg via INTRAVENOUS

## 2013-01-23 MED ORDER — PHENYLEPHRINE HCL 10 MG/ML IJ SOLN
10.0000 mg | INTRAVENOUS | Status: DC | PRN
  Administered 2013-01-23: 25 ug/min via INTRAVENOUS

## 2013-01-23 MED ORDER — MORPHINE SULFATE ER 100 MG PO TBCR
100.0000 mg | EXTENDED_RELEASE_TABLET | Freq: Two times a day (BID) | ORAL | Status: DC
  Administered 2013-01-24 – 2013-01-25 (×4): 100 mg via ORAL
  Filled 2013-01-23 (×6): qty 1

## 2013-01-23 MED ORDER — OXYCODONE HCL 5 MG PO TABS
30.0000 mg | ORAL_TABLET | Freq: Four times a day (QID) | ORAL | Status: DC | PRN
  Administered 2013-01-23 – 2013-01-26 (×8): 30 mg via ORAL
  Filled 2013-01-23: qty 6
  Filled 2013-01-23: qty 2
  Filled 2013-01-23 (×7): qty 6

## 2013-01-23 MED ORDER — NEOSTIGMINE METHYLSULFATE 1 MG/ML IJ SOLN
INTRAMUSCULAR | Status: DC | PRN
  Administered 2013-01-23: 3 mg via INTRAVENOUS

## 2013-01-23 MED ORDER — BACITRACIN ZINC 500 UNIT/GM EX OINT
TOPICAL_OINTMENT | CUTANEOUS | Status: DC | PRN
  Administered 2013-01-23: 1 via TOPICAL

## 2013-01-23 MED ORDER — HYDROMORPHONE HCL PF 1 MG/ML IJ SOLN
0.2500 mg | INTRAMUSCULAR | Status: AC | PRN
  Administered 2013-01-23 (×3): 0.5 mg via INTRAVENOUS
  Administered 2013-01-23: 0.25 mg via INTRAVENOUS
  Administered 2013-01-23: 0.5 mg via INTRAVENOUS
  Administered 2013-01-23: 0.25 mg via INTRAVENOUS
  Administered 2013-01-23 (×2): 0.5 mg via INTRAVENOUS

## 2013-01-23 MED ORDER — HYDROMORPHONE HCL PF 1 MG/ML IJ SOLN
INTRAMUSCULAR | Status: AC
  Filled 2013-01-23: qty 2

## 2013-01-23 MED ORDER — PROMETHAZINE HCL 25 MG/ML IJ SOLN: 6.2500 mg | INTRAMUSCULAR | Status: DC | PRN

## 2013-01-23 MED ORDER — LISINOPRIL 20 MG PO TABS
20.0000 mg | ORAL_TABLET | Freq: Every day | ORAL | Status: DC
  Administered 2013-01-24 – 2013-01-26 (×3): 20 mg via ORAL
  Filled 2013-01-23 (×3): qty 1

## 2013-01-23 MED ORDER — KETOROLAC TROMETHAMINE 30 MG/ML IJ SOLN
INTRAMUSCULAR | Status: AC
  Administered 2013-01-23: 30 mg
  Filled 2013-01-23: qty 1

## 2013-01-23 MED ORDER — LIDOCAINE HCL (CARDIAC) 20 MG/ML IV SOLN
INTRAVENOUS | Status: DC | PRN
  Administered 2013-01-23: 50 mg via INTRAVENOUS

## 2013-01-23 MED ORDER — IBUPROFEN 400 MG PO TABS
400.0000 mg | ORAL_TABLET | Freq: Four times a day (QID) | ORAL | Status: DC | PRN
  Administered 2013-01-23: 400 mg via ORAL
  Filled 2013-01-23: qty 1

## 2013-01-23 MED ORDER — MIDAZOLAM HCL 2 MG/2ML IJ SOLN
INTRAMUSCULAR | Status: AC
  Administered 2013-01-23: 2 mg
  Filled 2013-01-23: qty 2

## 2013-01-23 MED ORDER — OXYCODONE HCL 5 MG PO TABS: 5.0000 mg | ORAL_TABLET | Freq: Once | ORAL | Status: DC | PRN

## 2013-01-23 MED ORDER — HYDROMORPHONE HCL PF 1 MG/ML IJ SOLN
INTRAMUSCULAR | Status: AC
  Administered 2013-01-23: 0.5 mg
  Filled 2013-01-23: qty 1

## 2013-01-23 MED ORDER — DEXTROSE-NACL 5-0.45 % IV SOLN
INTRAVENOUS | Status: DC
  Administered 2013-01-23 – 2013-01-25 (×7): via INTRAVENOUS

## 2013-01-23 MED ORDER — MIDAZOLAM HCL 5 MG/5ML IJ SOLN
INTRAMUSCULAR | Status: DC | PRN
  Administered 2013-01-23: 1 mg via INTRAVENOUS
  Administered 2013-01-23: 2 mg via INTRAVENOUS

## 2013-01-23 MED ORDER — OXYCODONE HCL 5 MG/5ML PO SOLN: 5.0000 mg | Freq: Once | ORAL | Status: DC | PRN

## 2013-01-23 MED ORDER — OXYCODONE HCL 5 MG PO TABS: 30.0000 mg | ORAL_TABLET | Freq: Four times a day (QID) | ORAL | Status: DC | PRN

## 2013-01-23 MED ORDER — SIMVASTATIN 10 MG PO TABS
10.0000 mg | ORAL_TABLET | Freq: Every day | ORAL | Status: DC
Start: 2013-01-23 — End: 2013-01-26
  Administered 2013-01-23 – 2013-01-25 (×3): 10 mg via ORAL
  Filled 2013-01-23 (×4): qty 1

## 2013-01-23 MED ORDER — LACTATED RINGERS IV SOLN
INTRAVENOUS | Status: DC | PRN
  Administered 2013-01-23 (×2): via INTRAVENOUS

## 2013-01-23 MED ORDER — OXYMETAZOLINE HCL 0.05 % NA SOLN
NASAL | Status: DC | PRN
  Administered 2013-01-23: 1 via NASAL

## 2013-01-23 MED ORDER — OXYMETAZOLINE HCL 0.05 % NA SOLN
2.0000 | NASAL | Status: AC
  Administered 2013-01-23 (×2): 2 via NASAL
  Filled 2013-01-23: qty 15

## 2013-01-23 MED ORDER — FENTANYL CITRATE 0.05 MG/ML IJ SOLN
INTRAMUSCULAR | Status: DC | PRN
  Administered 2013-01-23: 150 ug via INTRAVENOUS
  Administered 2013-01-23 (×2): 100 ug via INTRAVENOUS

## 2013-01-23 MED ORDER — BACITRACIN ZINC 500 UNIT/GM EX OINT
1.0000 "application " | TOPICAL_OINTMENT | Freq: Three times a day (TID) | CUTANEOUS | Status: DC
  Administered 2013-01-23 – 2013-01-26 (×8): 1 via TOPICAL
  Filled 2013-01-23 (×2): qty 15

## 2013-01-23 MED ORDER — OXYMETAZOLINE HCL 0.05 % NA SOLN
NASAL | Status: AC
  Filled 2013-01-23: qty 15

## 2013-01-23 MED ORDER — DEXAMETHASONE SODIUM PHOSPHATE 10 MG/ML IJ SOLN
8.0000 mg | Freq: Once | INTRAMUSCULAR | Status: AC
  Administered 2013-01-23 (×2): 10 mg via INTRAVENOUS
  Filled 2013-01-23: qty 1

## 2013-01-23 MED ORDER — BACITRACIN ZINC 500 UNIT/GM EX OINT
TOPICAL_OINTMENT | CUTANEOUS | Status: AC
  Filled 2013-01-23: qty 15

## 2013-01-23 MED ORDER — ONDANSETRON HCL 4 MG/2ML IJ SOLN
INTRAMUSCULAR | Status: DC | PRN
  Administered 2013-01-23: 4 mg via INTRAVENOUS

## 2013-01-23 MED ORDER — ONDANSETRON HCL 4 MG PO TABS: 4.0000 mg | ORAL_TABLET | ORAL | Status: DC | PRN

## 2013-01-23 MED ORDER — EPHEDRINE SULFATE 50 MG/ML IJ SOLN
INTRAMUSCULAR | Status: DC | PRN
  Administered 2013-01-23: 10 mg via INTRAVENOUS
  Administered 2013-01-23 (×2): 5 mg via INTRAVENOUS
  Administered 2013-01-23: 10 mg via INTRAVENOUS
  Administered 2013-01-23: 5 mg via INTRAVENOUS

## 2013-01-23 MED ORDER — PROPOFOL 10 MG/ML IV BOLUS
INTRAVENOUS | Status: DC | PRN
  Administered 2013-01-23: 180 mg via INTRAVENOUS

## 2013-01-23 SURGICAL SUPPLY — 102 items
APPLIER CLIP 9.375 MED OPEN (MISCELLANEOUS)
APPLIER CLIP 9.375 SM OPEN (CLIP)
APR CLP MED 9.3 20 MLT OPN (MISCELLANEOUS)
APR CLP SM 9.3 20 MLT OPN (CLIP)
ATTRACTOMAT 16X20 MAGNETIC DRP (DRAPES) ×2 IMPLANT
BALLN PULM 15 16.5 18X75 (BALLOONS)
BALLOON PULM 15 16.5 18X75 (BALLOONS) IMPLANT
BLADE SURG 10 STRL SS (BLADE) ×3 IMPLANT
BLADE SURG 15 STRL LF DISP TIS (BLADE) ×7 IMPLANT
BLADE SURG 15 STRL SS (BLADE) ×20
CANISTER SUCTION 2500CC (MISCELLANEOUS) ×5 IMPLANT
CATH FOLEY 2WAY  3CC  8FR (CATHETERS) ×1
CATH FOLEY 2WAY 3CC 8FR (CATHETERS) ×1 IMPLANT
CLEANER TIP ELECTROSURG 2X2 (MISCELLANEOUS) ×7 IMPLANT
CLIP APPLIE 9.375 MED OPEN (MISCELLANEOUS) ×3 IMPLANT
CLIP APPLIE 9.375 SM OPEN (CLIP) ×3 IMPLANT
CLOTH BEACON ORANGE TIMEOUT ST (SAFETY) ×7 IMPLANT
CONT SPEC 4OZ CLIKSEAL STRL BL (MISCELLANEOUS) ×9 IMPLANT
CORDS BIPOLAR (ELECTRODE) ×2 IMPLANT
COVER MAYO STAND STRL (DRAPES) ×2 IMPLANT
COVER SURGICAL LIGHT HANDLE (MISCELLANEOUS) ×7 IMPLANT
COVER TABLE BACK 60X90 (DRAPES) ×5 IMPLANT
CRADLE DONUT ADULT HEAD (MISCELLANEOUS) ×2 IMPLANT
DRAIN CHANNEL 10F 3/8 F FF (DRAIN) ×3 IMPLANT
DRAIN CHANNEL 15F RND FF W/TCR (WOUND CARE) ×2 IMPLANT
DRAIN PENROSE 1/2X12 LTX STRL (WOUND CARE) IMPLANT
DRAPE ORTHO SPLIT 77X108 STRL (DRAPES) ×5
DRAPE PROXIMA HALF (DRAPES) ×12 IMPLANT
DRAPE SURG ORHT 6 SPLT 77X108 (DRAPES) ×1 IMPLANT
ELECT COATED BLADE 2.86 ST (ELECTRODE) ×7 IMPLANT
ELECT REM PT RETURN 9FT ADLT (ELECTROSURGICAL) ×5
ELECTRODE REM PT RTRN 9FT ADLT (ELECTROSURGICAL) ×4 IMPLANT
EVACUATOR SILICONE 100CC (DRAIN) ×5 IMPLANT
GAUZE SPONGE 4X4 16PLY XRAY LF (GAUZE/BANDAGES/DRESSINGS) ×14 IMPLANT
GLOVE BIO SURGEON STRL SZ 6.5 (GLOVE) ×2 IMPLANT
GLOVE BIO SURGEON STRL SZ7.5 (GLOVE) ×2 IMPLANT
GLOVE BIOGEL PI IND STRL 7.0 (GLOVE) ×2 IMPLANT
GLOVE BIOGEL PI INDICATOR 7.0 (GLOVE) ×2
GLOVE ECLIPSE 8.0 STRL XLNG CF (GLOVE) ×10 IMPLANT
GLOVE SS BIOGEL STRL SZ 6.5 (GLOVE) ×2 IMPLANT
GLOVE SUPERSENSE BIOGEL SZ 6.5 (GLOVE) ×2
GLOVE SURG SS PI 6.5 STRL IVOR (GLOVE) ×6 IMPLANT
GLOVE SURG SS PI 7.5 STRL IVOR (GLOVE) ×4 IMPLANT
GOWN BRE IMP SLV AUR LG STRL (GOWN DISPOSABLE) IMPLANT
GOWN SRG XL XLNG 56XLVL 4 (GOWN DISPOSABLE) ×3 IMPLANT
GOWN STRL NON-REIN LRG LVL3 (GOWN DISPOSABLE) ×14 IMPLANT
GOWN STRL NON-REIN XL XLG LVL4 (GOWN DISPOSABLE)
GOWN STRL REIN XL XLG (GOWN DISPOSABLE) ×7 IMPLANT
GUARD TEETH (MISCELLANEOUS) ×5 IMPLANT
KIT BASIN OR (CUSTOM PROCEDURE TRAY) ×7 IMPLANT
KIT ROOM TURNOVER OR (KITS) ×5 IMPLANT
LOCATOR NERVE 3 VOLT (DISPOSABLE) ×2 IMPLANT
MARKER SKIN DUAL TIP RULER LAB (MISCELLANEOUS) ×2 IMPLANT
NS IRRIG 1000ML POUR BTL (IV SOLUTION) ×7 IMPLANT
PAD ARMBOARD 7.5X6 YLW CONV (MISCELLANEOUS) ×10 IMPLANT
PATTIES SURGICAL .5 X3 (DISPOSABLE) IMPLANT
PENCIL BUTTON HOLSTER BLD 10FT (ELECTRODE) ×3 IMPLANT
PENCIL FOOT CONTROL (ELECTRODE) ×7 IMPLANT
SOAP 2 % CHG 4 OZ (WOUND CARE) ×2 IMPLANT
SOLUTION ANTI FOG 6CC (MISCELLANEOUS) ×2 IMPLANT
SPECIMEN JAR MEDIUM (MISCELLANEOUS) ×5 IMPLANT
SPECIMEN JAR SMALL (MISCELLANEOUS) IMPLANT
SPONGE GAUZE 4X4 12PLY (GAUZE/BANDAGES/DRESSINGS) ×2 IMPLANT
SPONGE INTESTINAL PEANUT (DISPOSABLE) IMPLANT
SPONGE LAP 18X18 X RAY DECT (DISPOSABLE) ×3 IMPLANT
SPONGE LAP 4X18 X RAY DECT (DISPOSABLE) ×2 IMPLANT
STAPLER VISISTAT 35W (STAPLE) ×7 IMPLANT
SURGILUBE 2OZ TUBE FLIPTOP (MISCELLANEOUS) ×2 IMPLANT
SUT CHROMIC 3 0 PS 2 (SUTURE) ×11 IMPLANT
SUT CHROMIC 4 0 PS 2 18 (SUTURE) ×14 IMPLANT
SUT CHROMIC 5 0 P 3 (SUTURE) IMPLANT
SUT ETHILON 3 0 PS 1 (SUTURE) ×5 IMPLANT
SUT ETHILON 5 0 PS 2 18 (SUTURE) ×5 IMPLANT
SUT PROLENE 6 0 BV (SUTURE) ×2 IMPLANT
SUT SILK 0 FSL (SUTURE) ×2 IMPLANT
SUT SILK 2 0 FS (SUTURE) IMPLANT
SUT SILK 2 0 REEL (SUTURE) IMPLANT
SUT SILK 2 0 SH CR/8 (SUTURE) ×3 IMPLANT
SUT SILK 3 0 (SUTURE) ×5
SUT SILK 3 0 REEL (SUTURE) ×11 IMPLANT
SUT SILK 3 0 SH CR/8 (SUTURE) ×5 IMPLANT
SUT SILK 3-0 18XBRD TIE 12 (SUTURE) ×1 IMPLANT
SUT SILK 4 0 (SUTURE)
SUT SILK 4 0 REEL (SUTURE) ×3 IMPLANT
SUT SILK 4-0 18XBRD TIE 12 (SUTURE) ×3 IMPLANT
SUT VIC AB 3-0 SH 18 (SUTURE) ×2 IMPLANT
SUT VIC AB 3-0 SH 27 (SUTURE)
SUT VIC AB 3-0 SH 27XBRD (SUTURE) ×3 IMPLANT
SUT VIC AB 4-0 RB1 18 (SUTURE) IMPLANT
SYR BULB IRRIGATION 50ML (SYRINGE) ×2 IMPLANT
SYR INFLATE BILIARY GAUGE (MISCELLANEOUS) IMPLANT
TAPE HY-TAPE 1X5Y PINK NS LF (GAUZE/BANDAGES/DRESSINGS) ×2 IMPLANT
TOWEL OR 17X24 6PK STRL BLUE (TOWEL DISPOSABLE) ×7 IMPLANT
TOWEL OR 17X26 10 PK STRL BLUE (TOWEL DISPOSABLE) ×7 IMPLANT
TRAP SPECIMEN MUCOUS 40CC (MISCELLANEOUS) IMPLANT
TRAY ENT MC OR (CUSTOM PROCEDURE TRAY) ×5 IMPLANT
TRAY FOLEY CATH 14FRSI W/METER (CATHETERS) IMPLANT
TUBE CONNECTING 12X1/4 (SUCTIONS) ×9 IMPLANT
TUBE FEEDING 10FR FLEXIFLO (MISCELLANEOUS) IMPLANT
UNDERPAD 30X30 INCONTINENT (UNDERPADS AND DIAPERS) ×3 IMPLANT
WATER STERILE IRR 1000ML POUR (IV SOLUTION) ×5 IMPLANT
YANKAUER SUCT BULB TIP NO VENT (SUCTIONS) ×2 IMPLANT

## 2013-01-23 NOTE — Anesthesia Postprocedure Evaluation (Signed)
Anesthesia Post Note  Patient: Brent [name]  Procedure(s) Performed: Procedure(s) (LRB): DIRECT LARYNGOSCOPY; RIGID BRONCHOSCOPY (N/A) HEMIGLOSSECTOMY (Left) LEFT SELECTIVE NECK DISSECTION (Left) ESOPHAGOSCOPY (N/A)  Anesthesia type: General  Patient location: PACU  Post pain: Pain level controlled and Adequate analgesia  Post assessment: Post-op Vital signs reviewed, Patient's Cardiovascular Status Stable, Respiratory Function Stable, Patent Airway and Pain level controlled  Last Vitals:  Filed Vitals:   01/23/13 1315  BP:   Pulse: 83  Temp:   Resp: 10    Post vital signs: Reviewed and stable  Level of consciousness: awake, alert  and oriented  Complications: No apparent anesthesia complications

## 2013-01-23 NOTE — Consult Note (Signed)
Urology Consult  Requesting provider:  Dr. Lazarus Salines  CC: Hypospadias  HPI: 62 year old male had a neck dissection today by Dr. Lazarus Salines. Prior to the surgery an attempt was made to place a Foley catheter by the nursing staff. The catheter was placed and inflated, but there is no return of urine. He had approximately a 5 hour surgery with no return of urine during the surgery. His catheter was removed and the recovery room and he was not able to void. It should be noted that he is on heavy doses of narcotics. I did try to assess the patient with a history physical examination, but this was limited due to the patient's limited ability to process. He states that he has not had surgery on his hypospadias. He states he does not require catheter to void. He states he's had catheters placed in the past without problems. There is no blood at the urethral meatus. I asked the patient I did attempt to place a catheter and he agreed.  Procedure: Sterilely prepped the patient's distal penis and urethral meatus. I then placed a 14 French coud-tip catheter with ease. There was return of clear yellow urine. I inflated this with 10 cc of sterile water and connected this to closed drainage. There were no complications.  PMH: Past Medical History  Diagnosis Date  . Arthritis   . Tongue cancer   . DVT (deep venous thrombosis) 2006    associated with surgery  . PE (pulmonary embolism) 2006    associated with surgery  . Diverticulosis   . [facility]pert[place]     [facility] [place]  [facility]    PSH: Past Surgical History  Procedure Laterality Date  . Knee surgery Left   . Above knee leg amputation Left 2006    Dr. Renae Fickle  . Colonoscopy  2011    Allergies: No Known Allergies  Medications: Prescriptions prior to admission  Medication Sig Dispense Refill  . cyclobenzaprine (FLEXERIL) 10 MG tablet Take 10 mg by mouth 3 (three) times daily as needed for muscle spasms.      Marland Kitchen ibuprofen (ADVIL,MOTRIN) 200 MG tablet Take 400  mg by mouth every 8 (eight) hours as needed for pain.       Marland Kitchen lisinopril (PRINIVIL,ZESTRIL) 20 MG tablet Take 20 mg by mouth daily.      Marland Kitchen morphine (MS CONTIN) 100 MG 12 hr tablet 100 mg 2 (two) times daily.       . Multiple Vitamin (MULTIVITAMIN WITH MINERALS) TABS Take 1 tablet by mouth daily.      Marland Kitchen oxycodone (ROXICODONE) 30 MG immediate release tablet Take 30 mg by mouth every 6 (six) hours as needed for pain. Take 1-3 tabs q 6 hrs prn pain      . simvastatin (ZOCOR) 10 MG tablet Take 10 mg by mouth at bedtime.         Social History: History   Social History  . Marital Status: Married    Spouse Name: N/A    Number of Children: 3  . Years of Education: N/A   Occupational History  .      retired post Film/video editor   Social History Main Topics  . Smoking status: Former Smoker -- 1.00 packs/day for 30 years    Quit date: 12/22/2012  . Smokeless tobacco: Never Used  . Alcohol Use: Yes     Comment: one beer or bourbon drink per week  . Drug Use: No  . Sexually Active: Not  on file   Other Topics Concern  . Not on file   Social History Narrative  . No narrative on file    Family History: Family History  Problem Relation Age of Onset  . Prostate cancer    . Asthma Son   . Arthritis/Rheumatoid Mother   . Colon cancer Father 49    Review of Systems: Positive: Difficulty talking.  Negative: Urge to void, gross hematuria.  A further 10 point review of systems was negative except what is listed in the HPI.  Physical Exam: Filed Vitals:   01/23/13 1545  BP:   Pulse: 86  Temp:   Resp: 20    General: No acute distress.  Awake. CV:  S1 present. S2 present. Regular rate. Pulmonary: Equal effort bilaterally.  Clear to auscultation bilaterally. Abdomen: Soft.  Non- tender to palpation. Skin:  Normal turgor.  No visible rash. Extremity: No gross deformity of bilateral upper extremities.  No gross deformity of    bilateral lower extremities. Neurologic: Alert.  Appropriate mood.  Penis:  Uncircumcised.  No lesions. Urethra: Hypospadic meatus on the distal shaft. Mild meatal stenosis. Scrotum: No lesions.  No ecchymosis.  No erythema.   Studies:  No results found for this basename: HGB, WBC, PLT,  in the last 72 hours  No results found for this basename: NA, K, CL, CO2, BUN, CREATININE, CALCIUM, MAGNESIUM, GFRNONAA, GFRAA,  in the last 72 hours   No results found for this basename: PT, INR, APTT,  in the last 72 hours   No components found with this basename: ABG,     Assessment:  Hypospadias.  Plan: -Catheter placed with ease (14Fr coude). -Discontinue catheter when primary team sees fit (would be a good idea for him to be on less narcotics and to be ambulating). -No need for GU follow up as an outpatient. -Call with questions. -Peri-operative antibiotics will be sufficient for cath placement.    Pager: [phone]    CC: Dr. Lazarus Salines

## 2013-01-23 NOTE — Progress Notes (Signed)
MEDICATION RELATED CONSULT NOTE - INITIAL   Pharmacy Consult for Pain managment Indication:   No Known Allergies  Patient Measurements:   Adjusted Body Weight: 61.6 kg  Vital Signs: Temp: 98.2 F (36.8 C) (07/03 1239) Temp src: Oral (07/03 0616) BP: 107/57 mmHg (07/03 1505) Pulse Rate: 86 (07/03 1545) Intake/Output from previous day:   Intake/Output from this shift: Total I/O In: 1900 [I.V.:1900] Out: 140 [Drains:40; Blood:100]  Labs: No results found for this basename: WBC, HGB, HCT, PLT, APTT, CREATININE, LABCREA, CREATININE, CREAT24HRUR, MG, PHOS, ALBUMIN, PROT, ALBUMIN, AST, ALT, ALKPHOS, BILITOT, BILIDIR, IBILI,  in the last 72 hours The CrCl is unknown because both a height and weight (above a minimum accepted value) are required for this calculation.   Microbiology: No results found for this or any previous visit (from the past 720 hour(s)).  Medical History: Past Medical History  Diagnosis Date  . Arthritis   . Tongue cancer   . DVT (deep venous thrombosis) 2006    associated with surgery  . PE (pulmonary embolism) 2006    associated with surgery  . Diverticulosis   . [facility]ension     [facility] [place]  [facility]    Medications:  Prescriptions prior to admission  Medication Sig Dispense Refill  . cyclobenzaprine (FLEXERIL) 10 MG tablet Take 10 mg by mouth 3 (three) times daily as needed for muscle spasms.      Marland Kitchen ibuprofen (ADVIL,MOTRIN) 200 MG tablet Take 400 mg by mouth every 8 (eight) hours as needed for pain.       Marland Kitchen lisinopril (PRINIVIL,ZESTRIL) 20 MG tablet Take 20 mg by mouth daily.      Marland Kitchen morphine (MS CONTIN) 100 MG 12 hr tablet 100 mg 2 (two) times daily.       . Multiple Vitamin (MULTIVITAMIN WITH MINERALS) TABS Take 1 tablet by mouth daily.      Marland Kitchen oxycodone (ROXICODONE) 30 MG immediate release tablet Take 30 mg by mouth every 6 (six) hours as needed for pain. Take 1-3 tabs q 6 hrs prn pain      . simvastatin (ZOCOR) 10 MG tablet Take 10 mg by mouth at  bedtime.        Assessment:Stage III squamous cell carcinoma, left oral tongue  OR 7/3: direct laryngoscopy, bronchoscopy, esophagoscopy, left hemiglossectomy, left functional neck dissection   Pharmacy has been asked to help manage pain regimen post-op. PTA patient was on MSContin 100mg  BID and OxyIR 30mg  q6hrs prn for degenerative changes in back and legs.Patient also has a h/o MRSA and a L AKA. Other PMH includes arthritis, HTN, DVT/PE in 2006, and diverticulosis.  Calculations: OxyIR 30mg  po = 45mg  po Morphine = 15mg  IV Morphine.  Goal of Therapy:  Lower/more toleratable pain score <=4.   Plan:  Spoke with RN Alphonzo Lemmings and encouraged her to give patient prn OxyIR as he takes at home. I will also add Morphine 2-6mg  IV (to start) for breakthrough pain due to opioid tolerance. Can increase morphine doses by increments of 2 if needed.    Keyoni Lapinski S. Merilynn Finland, PharmD, BCPS Clinical Staff Pharmacist Pager [phone]  Misty Stanley Stillinger 01/23/2013,5:01 PM

## 2013-01-23 NOTE — Progress Notes (Addendum)
Gave update on patient to dr. Georgette Dover.  Asked me to call Dr. Margarita Grizzle at [phone], for futher orders and to explain the situation to him.

## 2013-01-23 NOTE — Op Note (Signed)
01/23/2013  12:38 PM    Brent [name]  [id number]   Pre-Op Dx:  Stage III squamous cell carcinoma, left oral tongue  Post-op Dx: same  Proc: direct laryngoscopy, bronchoscopy, esophagoscopy, left hemiglossectomy, left functional neck dissection   Surg:  Cephus Richer MD  Assistant: Melvenia Beam M.D.  Anes:  General mask converted to general nasotracheal  EBL:  200 mL  Comp:  none  Findings:  A 5 x 4 cm ulcerated exophytic and endophytic lesion of the left lateral oral tongue. Does not extend to midline, to base of tongue, or to floor of mouth. Upon excision, frozen section margins were clear.  On neck dissection, multiple small nodes in levels 1,2,3, and 4.  On panendoscopy, no significant findings.  Procedure: with the patient in a comfortable supine position, general mask and intravenous anesthesia was administered.  At an appropriate level, the table was turned 90 the patient placed in a slight reverse Trendelenburg. A clean preparation and draping was accomplished. A rubber tooth guard was applied.   The Jackson sliding laryngoscope was introduced and placed above the glottis. The endolarynx looked normal. Through the scope, a 30 rigid bronchoscopic telescope was inserted and inspection was performed down to the level of the secondary bronchi on both sides with no significant findings. The  Whitsett scope and bronchoscope were removed.  The cervical esophagoscope was lubricated and passed to its full length without difficulty with no significant findings. It was removed.  The anterior commissure laryngoscope was introduced and full direct laryngoscopy was performed with no significant findings. The scope was removed. The dental guard was removed and the dental status was intact. The oral cavity and both sides of the neck were palpated with the findings as described above.  The Jean Rosenthal scope was reintroduced, and the patient was nasotracheally intubated without  difficulty.anesthesia was continued per nasotracheal tube. A Foley catheter was placed with some difficulty given hypospadias.  At this point the bed was turned back towards anesthesia.  A [name] mouth gag was placed without difficulty.  A moist lap pad was placed as a throat pack. The oral findings were as above.  A 0 silk stitch was placed in the anterior midline of the tongue to serve as a retractor. Under direct vision, with at least a 1 cm margin, the excision was marked with the cautery around the visible and palpable tumor. This was carried down into the tongue musculature. Care was taken to keep an adequate margin. In the posterior inferior tongue, branches of the lingual artery and vein were identified and controlled with silk. Probable hypoglossal nerve branches also.  The specimen was marked for orientation. Upon removal, it was inspected. Areas where there may have been closer margins were sampled for frozen section all of which returned as benign.    Hemostasis was observed. The resection was closed with deep 3-0 Vicryl to close the dead space, and then interrupted 3-0 Vicryl at the mucosa. The tongue was not tethered posteriorly or to the floor of mouth at the completion of the closure.  The pharynx was suctioned clean and the throat pack was removed and replaced with a fresh one. This completed the oral portion of the procedure.  A shoulder roll was placed and the neck was extended and rotated to the right for access to the left neck. The head was adequately supported. A sterile preparation and draping of the entire left neck was performed in the standard fashion.  A transverse wrinkle was identified  and then a vertical limb was planned in the submental triangle. This was crosshatched for orientation. The incision was executed sharply through skin and subcutaneous fat. The platysma was lysed with a electrocautery. Subplatysmal flaps were raised superiorly to the mandible, anteriorly to  the mentum  And midline, and inferiorly to the clavicle.  Posterior dissection was taken just behind the sternocleidomastoid muscle from the mastoid down.  Using I nerve stimulator, the ramus mandibularis branch of the facial nerve was identified and dissected anteriorly and posteriorly. This was elevated and protected.  Working along the body of the mandible, the periosteum was incised and the facial artery and vein were divided and controlled with silk ligature. Small lymph nodes adjacent to the facial artery and vein were continued with the specimen. Working posteriorly, the tail of the parotid gland was lysed between the angle of the mandible and the mastoid tip. Working anteriorly, the right anterior belly of the digastric was identified and the dissection was carried from this point into the submental triangle and then onto the left anterior belly of the digastric. The anterior belly on the left side was resected. Working posteriorly from this point and inferiorly from the mandible, the submandibular triangle was dissected. Ranine veins were controlled. Sub- mandibular duct was controlled.  lingual nerve and hypoglossal nerve were preserved.  the facial vessels were controlled inferior to the gland.the gland and submandibular contents were swept downward. The anterior belly of the digastric was dissected down to the tendon and off of the hyoid bone. The anterior dissection was carried down the omohyoid muscle.  The fascia on the lateral surface of the sternocleidomastoid muscle was dissected and rolled anteriorly. It was wrapped around the anterior border of the muscle from the mastoid tip down to the clavicle. Working further, posterior triangle was identified and the dissection stopped at the cervical nerve roots. The spinal accessory nerve was identified and dissected upward toward the jugular foramen. Soft tissue lateral to the nerve was dissected and incised. This was dissected under the medial  surface of the sternomastoid muscle down to the levator scapulae and then brought forward along the muscular floor and under the spinal accessory nerve.   Working forward from the cervical roots, the jugular vein was identified.with sharp dissection, the fascia was incised on the superior aspect of the vein and then rolled forward laterally and anterior vein and finally on the medial surface. Branches were controlled with silk ligature. 1 small perforation was controlled with a figure-of-eight 6-0 Prolene. The vagus nerve was protected. The fascia on the lateral surface of the carotid artery was dissected forward. At the posterior inferior jugular vein, lymphatic-appearing tissue was controlled with silk ligature. There were multiple small nodes applied along the jugular vein in  levels 2, 3, and 4.  After completing the carotid dissection, the specimen was dissected forward along the muscular floor of the neck. The fascia was not violated. The phrenic nerve was not identified.  The specimen was marked for orientation before removal. A thorough dissection of levels 1A, 1B, 2a, 2b, 3, and 4 was felt to have been accomplished.  Hemostasis was observed. With Valsalva, no further bleeding was identified nor chyle leakage. The wound was thoroughly irrigated.  A 15 French round fluted drain was placed into the bed. Retention stitches were removed. The flaps appeared viable. There were closed at the platysma layer with 4-0 chromic. The skin was closed with staples. The drain was placed on suction and observed to be working  properly. The wound is cleaned and bacitracin ointment applied.  The pharynx was suctioned clear of a small amount of serosanguineous secretions. The throat pack was removed.  there was relatively limited tongue edema.  Tracheostomy was not felt indicated.  Dispo:   PACU to 3300.  Plan:  Ice, elevation, analgesia, observation for any airway compromise. Await pathology reports. He will  need  postoperative radiation based on the tumor size, with the status of the lymph nodes still pending. We will advance diet and activity as comfortable. Remove Foley catheter when he is able to ambulate on his own.  Cephus Richer MD
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
## 2013-01-23 NOTE — OR Nursing (Signed)
Three frozen specimens tubed to pathology at 9:11. Called pathology to notify of tubed specimens at 9:12. Pathology called to confirm that they had arrived at 9:17. Pathology called with results at 9:32.  Oralia Manis, RN

## 2013-01-23 NOTE — Interval H&P Note (Signed)
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 After consideration of risks, benefits and other options for treatment, the patient has consented to  Procedure(s): DIRECT LARYNGOSCOPY (N/A) FLEXIBLE BRONCHOSCOPY (N/A) ESOPHAGOSCOPY (N/A) HEMIGLOSSECTOMY (N/A) LEFT SELECTIVE NECK DISSECTION (Left) as a surgical intervention .  The patient's history has been re-reviewed, patient re-examined, no change in status, stable for surgery.  I have re-reviewed the patient's chart and labs.  Questions were answered to the patient's satisfaction.     [name]

## 2013-01-23 NOTE — Anesthesia Procedure Notes (Addendum)
Performed by: Gwenyth Allegra   Procedure Name: Intubation Date/Time: 01/23/2013 8:05 AM Performed by: Gwenyth Allegra Pre-anesthesia Checklist: Patient identified, Timeout performed, Emergency Drugs available, Patient being monitored and Suction available Patient Re-evaluated:Patient Re-evaluated prior to inductionOxygen Delivery Method: Circle system utilized Preoxygenation: Pre-oxygenation with 100% oxygen Intubation Type: IV induction Ventilation: Mask ventilation without difficulty Nasal Tubes: Right, Nasal Rae and Nasal prep performed Tube size: 7.5 mm Number of attempts: 2 Placement Confirmation: ETT inserted through vocal cords under direct vision,  breath sounds checked- equal and bilateral and positive ETCO2 Tube secured with: Tape Dental Injury: Teeth and Oropharynx as per pre-operative assessment

## 2013-01-23 NOTE — Progress Notes (Signed)
CRNA Adame stated that the patient had made no urinary output during the OR case.  Balloon deflated, catheter passed in an additional 1cm, unable to inflate balloon.  Told Dr. Lazarus Salines of situation.  Ordered to discontinue the urinary catheter, which was done.  Patient given urinal.  Dr. Georgette Dover needs to be called if patient has not passed urine in 3 hours from now, 4pm.

## 2013-01-23 NOTE — Progress Notes (Signed)
Patient states he cannot swallow,  Dr. Lazarus Salines at the bedside to evaluate, no new orders

## 2013-01-23 NOTE — Anesthesia Preprocedure Evaluation (Addendum)
Anesthesia Evaluation  Patient identified by MRN, date of birth, ID band Patient awake    Reviewed: Allergy & Precautions, H&P , NPO status , Patient's Chart, lab work & pertinent test results  History of Anesthesia Complications Negative for: history of anesthetic complications  Airway Mallampati: II TM Distance: >3 FB Neck ROM: Full    Dental  (+) Teeth Intact and Dental Advisory Given   Pulmonary neg pulmonary ROS, former smoker,    Pulmonary exam normal       Cardiovascular hypertension,     Neuro/Psych negative neurological ROS  negative psych ROS   GI/Hepatic negative GI ROS, Neg liver ROS,   Endo/Other  negative endocrine ROS  Renal/GU negative Renal ROS     Musculoskeletal   Abdominal   Peds  Hematology negative hematology ROS (+)   Anesthesia Other Findings   Reproductive/Obstetrics                          Anesthesia Physical Anesthesia Plan  ASA: II  Anesthesia Plan: General   Post-op Pain Management:    Induction: Intravenous  Airway Management Planned: Nasal ETT  Additional Equipment:   Intra-op Plan:   Post-operative Plan: Extubation in OR  Informed Consent:   Plan Discussed with: CRNA, Anesthesiologist and Surgeon  Anesthesia Plan Comments:        Anesthesia Quick Evaluation

## 2013-01-23 NOTE — Progress Notes (Signed)
Subjective: He is being seen by urology right now. He is breathing without difficulty, and has no specific complaints. He is able to talk a little bit.  Objective: Vital signs in last 24 hours: Temp:  [98.2 F (36.8 C)-99.5 F (37.5 C)] 98.2 F (36.8 C) (07/03 1239) Pulse Rate:  [63-96] 86 (07/03 1545) Resp:  [10-24] 20 (07/03 1545) BP: (105-133)/(57-90) 107/57 mmHg (07/03 1505) SpO2:  [89 %-100 %] 97 % (07/03 1545) Weight change:     Intake/Output from previous day:   Intake/Output this shift: Total I/O In: 1900 [I.V.:1900] Out: 140 [Drains:40; Blood:100]  PHYSICAL EXAM: That looks excellent, no swelling or hematoma. Drain is functioning normally. He has no stridor or any difficulty breathing. Tongue closure is intact with some dried blood but no hematoma or swelling.  Lab Results: No results found for this basename: WBC, HGB, HCT, PLT,  in the last 72 hours BMET No results found for this basename: NA, K, CL, CO2, GLUCOSE, BUN, CREATININE, CALCIUM,  in the last 72 hours  Studies/Results: No results found.  Medications: I have reviewed the patient's current medications.  Assessment/Plan: Stable postop check. Continue inpatient care. Urology evaluating right now for inability to pass a urinary catheter.   LOS: 0 days   Nashaly Dorantes 01/23/2013, 4:03 PM

## 2013-01-23 NOTE — Preoperative (Signed)
Beta Blockers   Reason not to administer Beta Blockers:Not Applicable 

## 2013-01-23 NOTE — Transfer of Care (Signed)
Immediate Anesthesia Transfer of Care Note  Patient: Brent [name]  Procedure(s) Performed: Procedure(s): DIRECT LARYNGOSCOPY; RIGID BRONCHOSCOPY (N/A) HEMIGLOSSECTOMY (Left) LEFT SELECTIVE NECK DISSECTION (Left) ESOPHAGOSCOPY (N/A)  Patient Location: PACU  Anesthesia Type:General  Level of Consciousness: awake and alert   Airway & Oxygen Therapy: Patient Spontanous Breathing and Patient connected to nasal cannula oxygen  Post-op Assessment: Report given to PACU RN and Post -op Vital signs reviewed and stable  Post vital signs: Reviewed and stable  Complications: No apparent anesthesia complications

## 2013-01-24 MED ORDER — ALPRAZOLAM 0.5 MG PO TABS
0.5000 mg | ORAL_TABLET | Freq: Three times a day (TID) | ORAL | Status: DC | PRN
  Administered 2013-01-24 – 2013-01-26 (×5): 0.5 mg via ORAL
  Filled 2013-01-24 (×5): qty 1

## 2013-01-24 MED ORDER — WHITE PETROLATUM GEL
Status: AC
  Administered 2013-01-24: 17:00:00
  Filled 2013-01-24: qty 5

## 2013-01-24 MED ORDER — FLEET ENEMA 7-19 GM/118ML RE ENEM
1.0000 | ENEMA | Freq: Every day | RECTAL | Status: DC | PRN
  Administered 2013-01-24: 1 via RECTAL
  Filled 2013-01-24 (×2): qty 1

## 2013-01-24 MED ORDER — POLYETHYLENE GLYCOL 3350 17 G PO PACK
17.0000 g | PACK | Freq: Every day | ORAL | Status: DC
  Administered 2013-01-24 – 2013-01-26 (×3): 17 g via ORAL
  Filled 2013-01-24 (×3): qty 1

## 2013-01-24 NOTE — Progress Notes (Signed)
01/24/2013 2:29 PM  Brent [name] [id number]  Post-Op Day 1    Temp:  [98.2 F (36.8 C)-99.5 F (37.5 C)] 98.9 F (37.2 C) (07/04 1154) Pulse Rate:  [73-86] 77 (07/04 0400) Resp:  [11-20] 13 (07/04 0400) BP: (107-145)/(57-81) 121/71 mmHg (07/04 0400) SpO2:  [97 %-100 %] 97 % (07/04 0400),     Intake/Output Summary (Last 24 hours) at 01/24/13 1429 Last data filed at 01/24/13 1051  Gross per 24 hour  Intake   1990 ml  Output   3931 ml  Net  -1941 ml    JP drain:  210 ml since surgery.  Results for orders placed during the hospital encounter of 01/23/13 (from the past 24 hour(s))  CBC     Status: Abnormal   Collection Time    01/23/13  7:50 PM      Result Value Range   WBC 8.0  4.0 - 10.5 K/uL   RBC 2.93 (*) 4.22 - 5.81 MIL/uL   Hemoglobin 9.1 (*) 13.0 - 17.0 g/dL   HCT 40.9 (*) 81.1 - 91.4 %   MCV 91.5  78.0 - 100.0 fL   MCH 31.1  26.0 - 34.0 pg   MCHC 34.0  30.0 - 36.0 g/dL   RDW 78.2  95.6 - 21.3 %   Platelets 137 (*) 150 - 400 K/uL  CREATININE, SERUM     Status: None   Collection Time    01/23/13  7:50 PM      Result Value Range   Creatinine, Ser 0.79  0.50 - 1.35 mg/dL   GFR calc non Af Amer >90  >90 mL/min   GFR calc Af Amer >90  >90 mL/min    SUBJECTIVE:  Pain in back, pain for lack of BM, some mouth/neck surgical pain.  No SOB.  Some anxiety.  Got behind in pain relief last evening, but good now.  Uses Fleet's enema and prunes at home to stay regular.  Dr. Margarita Grizzle (Urology) placed Coude without diff.  Recommends d/c when pt ambulatory  OBJECTIVE:  Speaking quite well.  Breathing easily.  Mouth clean.  Tongue suture line intact.  Neck flat.  Drains functioning.    IMPRESSION:  Satisfactory check.  Constipation pain, back pain, claustrophobia  PLAN:  Daily enemas.  Add prune juice.  Add Miralax.  MS Contin, Oxycontin, and prn IV Morphine written.  Also Xanax.  Will ambulate with crutches or walker.  Continue wall suction to drain.   [name]

## 2013-01-25 NOTE — Progress Notes (Signed)
01/25/2013 1:54 PM  Brent [name] [id number]  Post-Op Day 2    Temp:  [98.1 F (36.7 C)-99.4 F (37.4 C)] 99.4 F (37.4 C) (07/05 1136) Pulse Rate:  [71-89] 79 (07/05 0815) Resp:  [13-22] 19 (07/05 0815) BP: (127-163)/(72-93) 143/89 mmHg (07/05 0815) SpO2:  [98 %-100 %] 99 % (07/05 0815),     Intake/Output Summary (Last 24 hours) at 01/25/13 1354 Last data filed at 01/25/13 1137  Gross per 24 hour  Intake 3010.42 ml  Output   2711 ml  Net 299.42 ml   JP drain 140 ml/24h  No results found for this or any previous visit (from the past 24 hour(s)).  SUBJECTIVE:  Minimal mouth/neck pain.  Swallowing liquids and soft solids.  No SOB.  Voiding successfully with catheter out.  Back pain controlled.  Constipation controlled.  OBJECTIVE:  Mouth moist.  Tongue mobile with intact suture line and minimal swelling.  Breathing well.  Voice fairly clear.  Neck flat and intact.  Ramus mandibularis somewhat weak.  CN XI intact.    IMPRESSION:  Satisfactory check  PLAN:  Decrease IV. Increase diet.  Out of ACU.  Increase activity.  Work back towards home pain regimen.  Drain out and discharge home when less drainage.  [name]

## 2013-01-26 MED ORDER — IBUPROFEN 400 MG PO TABS: 400.0000 mg | ORAL_TABLET | Freq: Four times a day (QID) | ORAL | Status: DC | PRN

## 2013-01-26 MED ORDER — POLYETHYLENE GLYCOL 3350 17 G PO PACK: 17.0000 g | PACK | Freq: Every day | ORAL | Status: DC

## 2013-01-26 MED ORDER — ALPRAZOLAM 0.5 MG PO TABS: 0.5000 mg | ORAL_TABLET | Freq: Three times a day (TID) | ORAL | Status: DC | PRN

## 2013-01-26 NOTE — Discharge Summary (Signed)
  01/26/2013 1:28 PM  [name] [id number]  Post-Op Day 3   Discharge Summary    Temp:  [98.4 F (36.9 C)-98.7 F (37.1 C)] 98.4 F (36.9 C) (07/06 0947) Pulse Rate:  [74-79] 77 (07/06 0947) Resp:  [16] 16 (07/06 0947) BP: (114-138)/(70-94) 119/70 mmHg (07/06 0947) SpO2:  [99 %-100 %] 100 % (07/06 0947) Weight:  [57.607 kg (127 lb)] 57.607 kg (127 lb) (07/05 2042),     Intake/Output Summary (Last 24 hours) at 01/26/13 1328 Last data filed at 01/26/13 0900  Gross per 24 hour  Intake 1108.34 ml  Output    525 ml  Net 583.34 ml   JP drain 25 ml last 24 hrs.  No results found for this or any previous visit (from the past 24 hour(s)).  SUBJECTIVE:  Pain controlled , back on home regimen.  Ambulatory on crutches.  Swallowing soft solids.  Having bowel movements.  OBJECTIVE:  Color, energy, hydration good.  Voice strong and fairly clear.  Tongue wound healing well with intact sutures.  Neck flat, wound intact.  Drain D/C'd.    IMPRESSION:  Satisfactory check.  PLAN:  Discharge home.  Wound care, diet, activity discussed.  Will call with Pathology report when available.  Recheck my office 8 days for staple removal.   Admit date: 3 JUL  Discharge date: 6 JUL  Final diagnosis: 1) Stage III LEFT tongue cancer. 2) hypospadias.  3) chronic constipation.  4) chronic narcotic dependence  Procedure:  LEFT hemi-glossectomy, LEFT modified functional neck dissection, pan endoscopy, 3 JUL  Comp:  Difficult Foley catheterization  Cond:  Ambulatory, pain controlled, drains out.  Eating, drinking, breathing well.  Passing urine and bowel movements well.  Re check:  8 days my office  Rx:  Miralax, Xanax  Hospital Course:  Pt came in directly to surgery and underwent the above procedure.  Post op, noted to have no urine flow.  Consultation with Dr. Margarita Grizzle who place a Coude catheter.  Good urine flow achieved.  Had substantial pain from constipation which was alleviated with Fleet's  enema, Miralax.  Pharmacy consultation requested for assistance with narcotic management.  Back pain controlled with muscle relaxants, Xanax and narcotics.  Advanced diet and activity.  Foley cath removed POD1.  JP drain removed POD3.  Discharged to home in care of family.     [name]

## 2013-01-27 ENCOUNTER — Encounter (HOSPITAL_COMMUNITY): Payer: Self-pay | Admitting: Otolaryngology

## 2013-02-03 ENCOUNTER — Other Ambulatory Visit: Payer: Self-pay | Admitting: Oncology

## 2013-02-06 ENCOUNTER — Telehealth: Payer: Self-pay | Admitting: Oncology

## 2013-02-06 ENCOUNTER — Ambulatory Visit (HOSPITAL_BASED_OUTPATIENT_CLINIC_OR_DEPARTMENT_OTHER): Payer: Medicare Other | Admitting: Oncology

## 2013-02-06 VITALS — BP 104/59 | HR 83 | Temp 97.8°F | Resp 18 | Ht 68.0 in | Wt 145.4 lb

## 2013-02-06 DIAGNOSIS — C029 Malignant neoplasm of tongue, unspecified: Secondary | ICD-10-CM

## 2013-02-06 MED ORDER — ONDANSETRON HCL 8 MG PO TABS: 8.0000 mg | ORAL_TABLET | Freq: Two times a day (BID) | ORAL | Status: DC | PRN

## 2013-02-06 MED ORDER — PROCHLORPERAZINE MALEATE 10 MG PO TABS: 10.0000 mg | ORAL_TABLET | Freq: Four times a day (QID) | ORAL | Status: DC | PRN

## 2013-02-06 MED ORDER — LORAZEPAM 0.5 MG PO TABS: 0.5000 mg | ORAL_TABLET | Freq: Four times a day (QID) | ORAL | Status: DC | PRN

## 2013-02-06 NOTE — Patient Instructions (Addendum)
1.  Diagnosis:  Tongue Squamous cell carcinoma: - Primary location:  Left tongue - Potential causes:  Smoking, alcohol.  - Stage:  Stage IVA (due to presence of two lymph nodes).  - Prognosis:  Without more therapy, the chance of recurrence is about 30%.   2.  Recommendations: - Definitely should receive adjuvant radiation to the tongue and the neck. - Consider chemotherapy cisplatin once every 3 weeks x 3 doses. - Potential side effects of cisplatin include but not limited to nausea vomiting, alopecia, fatigue, mucositis, ototoxicity, nephrotoxocity, abnormal electrolytes, cytopenia, risk of bleeding and infection.

## 2013-02-06 NOTE — Telephone Encounter (Signed)
gave pt appt for lab and MD on August, pt schedule for chemo class ,needs to call audiology tomorrow

## 2013-02-07 ENCOUNTER — Other Ambulatory Visit: Payer: Self-pay | Admitting: *Deleted

## 2013-02-07 NOTE — Progress Notes (Signed)
[facility]  [phone] [phone]   OFFICE PROGRESS NOTE   Cc:  DYER,[name], MD  DIAGNOSIS: left lingual squamous cell carcinoma; pT1 N2a cM0 (stage IVA)  PAST THERAPY: left hemiglossectomy on 01/23/2013 with left neck dissection.  Pathology was notable for negative final margin; 2/23 nodes positive without extracapsular extension; but positive for lymphovascular invasion.   CURRENT THERAPY:  Here to discuss adjuvant option(s).   INTERVAL HISTORY: Brent [name] 62 y.o. male returns for regular follow up with his wife.  He has chronic lower back pain for many years.  He takes pain meds for lower back pain.  He also has left neck pain from recent surgery.  He denied skin rash/bleeding/purulent discharge.  The rest of the 14-point review of system was negative.    Past Medical History  Diagnosis Date  . Arthritis   . Tongue cancer   . DVT (deep venous thrombosis) 2006    associated with surgery  . PE (pulmonary embolism) 2006    associated with surgery  . Diverticulosis   . Hypertension     [facility] [place]  [place] Jules Husbands    Past Surgical History  Procedure Laterality Date  . Knee surgery Left   . Above knee leg amputation Left 2006    Dr. Renae Fickle  . Colonoscopy  2011  . Direct laryngoscopy N/A 01/23/2013    Procedure: DIRECT LARYNGOSCOPY; RIGID BRONCHOSCOPY;  Surgeon: [name], MD;  Location: [facility] OR;  Service: ENT;  Laterality: N/A;  . Glossectomy Left 01/23/2013    Procedure: HEMIGLOSSECTOMY;  Surgeon: [name], MD;  Location: [facility] OR;  Service: ENT;  Laterality: Left;  . Radical neck dissection Left 01/23/2013    Procedure: LEFT SELECTIVE NECK DISSECTION;  Surgeon: [name], MD;  Location: [facility] OR;  Service: ENT;  Laterality: Left;  . Esophagoscopy N/A 01/23/2013    Procedure: ESOPHAGOSCOPY;  Surgeon: [name], MD;  Location: [facility] OR;  Service: ENT;  Laterality: N/A;    Current Outpatient Prescriptions  Medication Sig Dispense Refill    . ALPRAZolam (XANAX) 0.5 MG tablet Take 0.5 mg by mouth daily as needed for anxiety.      . cyclobenzaprine (FLEXERIL) 10 MG tablet Take 10 mg by mouth daily as needed for muscle spasms.       Marland Kitchen ibuprofen (ADVIL,MOTRIN) 400 MG tablet Take 1 tablet (400 mg total) by mouth every 6 (six) hours as needed.  30 tablet  0  . lisinopril (PRINIVIL,ZESTRIL) 20 MG tablet Take 20 mg by mouth daily.      Marland Kitchen morphine (MS CONTIN) 100 MG 12 hr tablet 100 mg 2 (two) times daily.       . Multiple Vitamin (MULTIVITAMIN WITH MINERALS) TABS Take 1 tablet by mouth daily.      Marland Kitchen oxycodone (ROXICODONE) 30 MG immediate release tablet Take 30 mg by mouth every 6 (six) hours as needed for pain. Take 1-3 tabs q 6 hrs prn pain      . simvastatin (ZOCOR) 10 MG tablet Take 10 mg by mouth at bedtime.      Marland Kitchen LORazepam (ATIVAN) 0.5 MG tablet Take 1 tablet (0.5 mg total) by mouth every 6 (six) hours as needed (Nausea or vomiting).  30 tablet  1  . ondansetron (ZOFRAN) 8 MG tablet Take 1 tablet (8 mg total) by mouth every 12 (twelve) hours as needed. Take two times a day as needed for nausea or vomiting starting on the third day after chemotherapy.  30 tablet  1  . polyethylene glycol (MIRALAX / GLYCOLAX) packet Take 17 g by mouth daily.  30 each  3  . prochlorperazine (COMPAZINE) 10 MG tablet Take 1 tablet (10 mg total) by mouth every 6 (six) hours as needed (Nausea or vomiting).  30 tablet  1   No current facility-administered medications for this visit.    ALLERGIES:  has No Known Allergies.  REVIEW OF SYSTEMS:  The rest of the 14-point review of system was negative.   Filed Vitals:   02/06/13 1529  BP: 104/59  Pulse: 83  Temp: 97.8 F (36.6 C)  Resp: 18   Wt Readings from Last 3 Encounters:  02/06/13 145 lb 6.4 oz (65.953 kg)  01/25/13 127 lb (57.607 kg)  01/25/13 127 lb (57.607 kg)   ECOG Performance status: 1  PHYSICAL EXAMINATION:   General: well-nourished man in no acute distress. Eyes: no scleral icterus.  ENT: There was no tongue mass. The hemiglossectomy wound has healed without bleeding/ulceration.  Neck was without thyromegaly. Lymphatics: Negative cervical, supraclavicular or axillary adenopathy. Respiratory: lungs were clear bilaterally without wheezing or crackles. Cardiovascular: Regular rate and rhythm, S1/S2, without murmur, rub or gallop. There was no pedal edema. GI: abdomen was soft, flat, nontender, nondistended, without organomegaly. Muscoloskeletal: no spinal tenderness of palpation of vertebral spine. His left leg was amputated above the knee. Skin exam was without echymosis, petichae. Neuro exam was nonfocal. Patient was able to get on and off exam table without assistance. Gait was normal. Patient was alert and oriented. Attention was good. Language was appropriate. Mood was normal without depression. Speech was not pressured. Thought content was not tangential.       LABORATORY/RADIOLOGY DATA:  Lab Results  Component Value Date   WBC 8.0 01/23/2013   HGB 9.1* 01/23/2013   HCT 26.8* 01/23/2013   PLT 137* 01/23/2013   GLUCOSE 106* 01/20/2013   ALKPHOS 62 01/10/2013   ALT 40 01/10/2013   AST 36* 01/10/2013   NA 139 01/20/2013   K 4.3 01/20/2013   CL 102 01/20/2013   CREATININE 0.79 01/23/2013   BUN 16 01/20/2013   CO2 29 01/20/2013    ASSESSMENT AND PLAN:    1.  Diagnosis:  Tongue Squamous cell carcinoma: - Primary location:  Left tongue - Potential causes:  History of smoking.  - Stage:  Stage IVA (due to presence of two lymph nodes).  - Prognosis:  Without more therapy, the chance of recurrence is about 30%.   2.  Recommendations: - Definitely should receive adjuvant radiation to the tongue and the neck. - Consider chemotherapy cisplatin once every 3 weeks x 3 doses due to presence of two malignant nodes and lymphovascular extension.  I stressed that there was no absolute indication for adjuvant chemo in addition to radiation due to lack of positive margin and extracapsular  extension.  Potential benefit of adjuvant chemo along with radiation is to increase the rate of local control, decrease risk of recurrence and potentially improve survival.  - Potential side effects of cisplatin include but not limited to nausea vomiting, alopecia, fatigue, mucositis, ototoxicity, nephrotoxocity, abnormal electrolytes, cytopenia, risk of bleeding and infection.  - Mr. Soley expressed informed understanding and wished to go with concurrent adjuvant chemoradiation.  3. In preparation for therapy, I made the following referral: - Speech path, Nutrition, PEG tube placement, chemo class, audiogram. -  I advised him to fill Compazine/Zofran/Ativan to be taken prn.  - Follow up:  In about 2-3 weeks  to address last minute questions before chemo.     I informed Mr. Garraway and his wife that I am leaving the practice.  [facility] will arrange for him to see another provider when he returns.    The length of time of the face-to-face encounter was 45 minutes. More than 50% of time was spent counseling and coordination of care.

## 2013-02-10 ENCOUNTER — Encounter (HOSPITAL_COMMUNITY): Payer: Self-pay | Admitting: Dentistry

## 2013-02-10 ENCOUNTER — Ambulatory Visit (HOSPITAL_COMMUNITY): Payer: Self-pay | Admitting: Dentistry

## 2013-02-10 ENCOUNTER — Other Ambulatory Visit: Payer: Self-pay | Admitting: *Deleted

## 2013-02-10 VITALS — BP 98/61 | HR 84 | Temp 98.2°F

## 2013-02-10 DIAGNOSIS — C021 Malignant neoplasm of border of tongue: Secondary | ICD-10-CM

## 2013-02-10 DIAGNOSIS — C029 Malignant neoplasm of tongue, unspecified: Secondary | ICD-10-CM

## 2013-02-10 DIAGNOSIS — Z0189 Encounter for other specified special examinations: Secondary | ICD-10-CM

## 2013-02-10 DIAGNOSIS — Z463 Encounter for fitting and adjustment of dental prosthetic device: Secondary | ICD-10-CM

## 2013-02-10 NOTE — Progress Notes (Signed)
Notified Ward Givens, [facility], of home care orders for PEG tube teaching.

## 2013-02-10 NOTE — Progress Notes (Signed)
Head and Neck Cancer Location of Tumor / Histology: Left lateral/ventral side of tongue.   Patient presented last year with an ulcer on his left lateral tongue. He recalls being told that it might be from dental trauma. He claims this came up and went back down again several times over the past year. Over the last 2-3 weeks, he has had rapidly progressive swelling and tenderness on the lateral left tongue.   Biopsies of left lateral/ventral tongue revealed: Invasive squamous cell carcinoma   Nutrition Status:  Weight changes: 30 lbs weight loss, but changed to low fat diet one year ago  Swallowing status: swallows small amounts Plans, if any, for PEG tube: will be placed 7/29.  Tobacco/Marijuana/Snuff/ETOH use: current every day smoker - stopped 1 week ago, 40 pack per year history, past heavy alcohol consumption   Past/Anticipated interventions by otolaryngology, if any: Biopsy on 01/02/13 / possible surgery to tongue and lymph node removal in neck. DIRECT LARYNGOSCOPY, GLOSSECTOMY, RADICAL NECK DISSECTION and RADICAL NECK DISSECTION on 01/23/2013.   Past/Anticipated interventions by medical oncology, if any: chemotherapy to be in conjunction with radiation.  Referrals yet, to any of the following?  Social Work? Will Schedule  Dentistry? Dr. Kristin Bruins 01/09/2013  Swallowing therapy? no Nutrition? Will schedule  Med/Onc? Dr. Gaylyn Rong 01/10/2013  PEG placement? 7/29 Hearing tests? 7/31  SAFETY ISSUES:  Prior radiation? no  Pacemaker/ICD? no  Possible current pregnancy? no  Is the patient on methotrexate? no  Current Complaints / other details:  Brent [name] here with his wife for follow up new.  He has pain from arthritis that he is rating at a 7/10 in his lower back and right knee.  He does have trouble swallowing large amounts.  He states that his voice sounds muffled due to his swollen tongue.

## 2013-02-10 NOTE — Progress Notes (Signed)
02/10/2013  Patient:            Brent [name] Date of Birth:  11-02-1950 MRN:                [id number]  BP 98/61  Pulse 84  Temp(Src) 98.2 F (36.8 C) (Oral)  [name] now presents for insertion of upper and lower fluoride trays and completion of the fabrication of the radiation cone locator. Patient indicates that he is doing well from the surgery with Dr. Lazarus Salines. Patient indicates that he did get a cleaning from his primary dentist. Patient agrees to proceed with fabrication of the radiation cone locator and insertion of the fluoride trays. Patient indicated that he did pick up his FluoriSHIELD prescription  PROCEDURE: Radiation cone locator was tried in.  Appliance fit well to the mandibular teeth. The radiation cone locator was further fabricated to match the maxillary teeth and patient's arc of closure. The appliance was then adjusted and polished appropriately. The appliance was then labeled appropriately. The patient was then given instructions on the insertion of the appliance for her simulation appointment and during radiation treatments. Patient was able to duplicate the insertion of the appliance appropriately. The appliance was then again polished and disinfected appropriately.  Fluoride appliances were then tried in and adjusted as needed. [place]. Trismus device was fabricated 40 mm and 25 sticks. Postop instructions were provided in a written and verbal format concerning the use and care of appliances. All questions were answered. Patient to return to clinic for periodic oral examination in approximately 2 -3 weeks during radiation therapy. Patient is to call and schedule the appointment once he knows the times that radiation therapy will be delivered. Patient to call if questions or problems arise before then. The patient tolerated the procedure well. Patient was then dismissed in stable condition.  Charlynne Pander, DDS

## 2013-02-10 NOTE — Patient Instructions (Signed)
TRISMUS  Trismus is a condition where the jaw does not allow the mouth to open as wide as it usually does.  This can happen almost suddenly, or in other cases the process is so slow, it is hard to notice it-until it is too far along.  When the jaw joints and/or muscles have been exposed to radiation treatments, the onset of Trismus is very slow.  This is because the muscles are losing their stretching ability over a long period of time, as long as 2 YEARS after the end of radiation.  It is therefore important to exercise these muscles and joints.  TRISMUS EXERCISES   Stack of tongue depressors measuring the same or a little less than the last documented MIO (Maximum Interincisal Opening).  Secure them with a rubber band on both ends.  Place the stack in the patient's mouth, supporting the other end.  Allow 30 seconds for muscle stretching.  Rest for a few seconds.  Repeat 3-5 times  For all radiation patients, this exercise is recommended in the mornings and evenings unless otherwise instructed.  The exercise should be done for a period of 2 YEARS after the end of radiation.  MIO should be checked routinely on recall dental visits by the general dentist or the hospital dentist.  The patient is advised to report any changes, soreness, or difficulties encountered when doing the exercises.  FLUORIDE TRAYS PATIENT INSTRUCTIONS    Obtain prescription from the pharmacy.  Don't be surprised if it needs to be ordered.   Be sure to let the pharmacy know when you are close to needing a new refill for them to have it ready for you without interruption of Fluoride use.   The best time to use your Fluoride is before bed time.   You must brush your teeth very well and floss before using the Fluoride in order to get the best use out of the Fluoride treatments.   Place 1 drop of Fluoride gel per tooth in the tray.   Place the tray on your lower teeth and/or your upper teeth.  Make sure  the trays are seated all the way.  Remember, they only fit one way on your teeth.   Insert for 5 full minutes.   At the end of the 5 minutes, take the trays out.  SPIT OUT excess. .    Do NOT rinse your mouth!    Do NOT eat or drink after treatments for at least 30 minutes.  This is why the best time for your treatments is before bedtime.    Clean the inside of your Fluoride trays using COLD WATER and a toothbrush.    In order to keep your Trays from discoloring and free from odors, soak them overnight in denture cleaners such as Efferdent.  Do not use bleach or non denture products.    Store the trays in a safe dry place AWAY from any heat until your next treatment.    Bring the trays with you for your next dental check-up.  The dentist will confirm their fit.    If anything happens to your Fluoride trays, or they don't fit as well after any dental work, please let us know as soon as possible. 

## 2013-02-11 ENCOUNTER — Telehealth: Payer: Self-pay | Admitting: *Deleted

## 2013-02-11 ENCOUNTER — Other Ambulatory Visit: Payer: Medicare Other

## 2013-02-11 ENCOUNTER — Encounter: Payer: Self-pay | Admitting: *Deleted

## 2013-02-11 NOTE — Telephone Encounter (Signed)
Per staff message and POF I have scheduled appts.  JMW  

## 2013-02-11 NOTE — Telephone Encounter (Signed)
Lm gave an appt for Audiology for 02/20/13 @ 1pm. i also sent MW an email to add pt tx. Pt is aware that tx will be added. All the other appts was completed by [facility]...td

## 2013-02-12 ENCOUNTER — Other Ambulatory Visit: Payer: Self-pay | Admitting: *Deleted

## 2013-02-12 ENCOUNTER — Encounter: Payer: Self-pay | Admitting: *Deleted

## 2013-02-12 ENCOUNTER — Encounter (HOSPITAL_COMMUNITY): Payer: Self-pay | Admitting: Pharmacy Technician

## 2013-02-12 ENCOUNTER — Ambulatory Visit
Admission: RE | Admit: 2013-02-12 | Discharge: 2013-02-12 | Disposition: A | Discharge status: Home / Self Care | Payer: Medicare Other | Source: Ambulatory Visit | Attending: Radiation Oncology | Admitting: Radiation Oncology

## 2013-02-12 VITALS — BP 121/69 | HR 98 | Temp 97.8°F | Ht 68.0 in | Wt 151.3 lb

## 2013-02-12 DIAGNOSIS — C029 Malignant neoplasm of tongue, unspecified: Secondary | ICD-10-CM

## 2013-02-12 DIAGNOSIS — C0689 Malignant neoplasm of overlapping sites of other parts of mouth: Secondary | ICD-10-CM | POA: Insufficient documentation

## 2013-02-12 NOTE — Progress Notes (Signed)
Met with pt and his wife during appt with Dr. Mitzi Hansen.  Discussed the "Minimally Invasive Head and Neck and Intracranial Radiotherapy Immobilization" research project and the two types of masks under evaluation, including showing of examples of both, answered pt questions.  Patient indicated interest in participation.  Facilitated signing of study consent and HIPPA forms; provided copies to pt.

## 2013-02-12 NOTE — Progress Notes (Signed)
Please see the Nurse Progress Note in the MD Initial Consult Encounter for this patient. 

## 2013-02-13 ENCOUNTER — Ambulatory Visit: Payer: Self-pay | Admitting: Radiation Oncology

## 2013-02-13 NOTE — Progress Notes (Addendum)
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 2/23 lymph nodes positive, level II:   pN2b pT1  Interval Since Last Radiation:  Not applicable    Narrative:  The patient returns today for follow-up.  The patient returns to clinic today after having undergone resection on 01/23/2013 for his squamous cell carcinoma of the oral cavity as noted above. The patient was found to have an invasive squamous cell carcinoma which was well-differentiated. This measured 1.6 cm in depth and this involved skeletal muscle. The width of the tumor was 1.7 cm. Final margins were negative. Lymphovascular invasion was seen, in no. Neural invasion. As noted above, to/23 lymph nodes were positive.  The patient indicates that he has been recovering relatively well. He has chronic arthritis and he does take pain medicine regularly for his back pain. He is pleased how his operative area has done with no unexpected difficulties. The patient's case has been reviewed in head and neck conference. He is felt to be a good candidate to proceed with postoperative chemoradiotherapy and he has discussed the role of chemotherapy with medical oncology.                              ALLERGIES:  has No Known Allergies.  Meds: Current Outpatient Prescriptions  Medication Sig Dispense Refill  . ALPRAZolam (XANAX) 0.5 MG tablet Take 0.5 mg by mouth daily as needed for anxiety.      . cyclobenzaprine (FLEXERIL) 10 MG tablet Take 10 mg by mouth daily as needed for muscle spasms.       Marland Kitchen ibuprofen (ADVIL,MOTRIN) 400 MG tablet Take 1 tablet (400 mg total) by mouth every 6 (six) hours as needed.  30 tablet  0  . lisinopril (PRINIVIL,ZESTRIL) 20 MG tablet Take 20  mg by mouth every morning.       Marland Kitchen morphine (MS CONTIN) 100 MG 12 hr tablet 100 mg 2 (two) times daily.       . Multiple Vitamin (MULTIVITAMIN WITH MINERALS) TABS Take 1 tablet by mouth daily.      Marland Kitchen oxycodone (ROXICODONE) 30 MG immediate release tablet Take 30 mg by mouth every 6 (six) hours as needed for pain. Take 1-3 tabs q 6 hrs prn pain      . simvastatin (ZOCOR) 10 MG tablet Take 10 mg by mouth at bedtime.      . docusate sodium (COLACE) 100 MG capsule Take 100 mg by mouth daily as needed for constipation.      Marland Kitchen LORazepam (ATIVAN) 0.5 MG tablet Take 1 tablet (0.5 mg total) by mouth every 6 (six) hours as needed (Nausea or vomiting).  30 tablet  1  . ondansetron (ZOFRAN) 8 MG tablet Take 1 tablet (8 mg total) by mouth every 12 (twelve) hours as needed. Take two times a day as needed for nausea or vomiting starting on the third day after chemotherapy.  30 tablet  1  . prochlorperazine (COMPAZINE) 10 MG tablet Take 1 tablet (10 mg total) by mouth every 6 (six) hours as needed (Nausea or vomiting).  30 tablet  1   No current facility-administered medications for this encounter.  Physical Findings: The patient is in no acute distress. Patient is alert and oriented.  height is 5\' 8"  (1.727 m) and weight is 151 lb 4.8 oz (68.629 kg). His temperature is 97.8 F (36.6 C). His blood pressure is 121/69 and his pulse is 98. His oxygen saturation is 98%. .   General: Well-developed, in no acute distress HEENT: Normocephalic, atraumatic; oral cavity reveals a well healing area in the left tonsil with a suture present. The operative site is approximately 1-2 cm from the anterior aspect of the oral cavity. No palpable lymphadenopathy with well-healed surgical incision present within the left neck Cardiovascular: Regular rate and rhythm Respiratory: Clear to auscultation bilaterally GI: Soft, nontender, normal bowel sounds Extremities: No edema present   Lab Findings: Lab Results  Component Value  Date   WBC 8.0 01/23/2013   HGB 9.1* 01/23/2013   HCT 26.8* 01/23/2013   MCV 91.5 01/23/2013   PLT 137* 01/23/2013     Radiographic Findings: Dg Chest 2 View  01/20/2013   *RADIOLOGY REPORT*  Clinical Data: 62 year old male preoperative study for laryngoscopy.  Tongue cancer.  CHEST - 2 VIEW  Comparison: Chest CT 01/09/2013 and earlier.  Findings: Stable lung volumes.  Cardiac size and mediastinal contours are within normal limits.  Visualized tracheal air column is within normal limits.  No pneumothorax, pulmonary edema, pleural effusion or confluent pulmonary opacity. No acute osseous abnormality identified.  IMPRESSION: No acute cardiopulmonary abnormality.   Original Report Authenticated By: Erskine Speed, M.D.    Impression:    The patient is doing well postoperatively from his head and neck cancer. He is appropriate at this time to proceed with adjuvant chemoradiotherapy. I anticipate approximately 6 weeks of radiation.   I discussed the potential and expected benefit of such a treatment. I also discuss the possible side effects and risks of treatment as well. All of his questions were answered. The patient is quite concerned about pain medication management during his treatment and we discussed this in detail. The patient's current pain medication regimen will be used as a starting point and adjusted further as necessary.  Plan:   -Followup with medical oncology as scheduled  -Placement of a feeding tube scheduled for 02/18/2013  -A nutrition consult being scheduled and followup with speech therapy/swallowing therapy he also has been ordered  -Simulation as soon as this can be arranged in our clinic.    I spent 25 minutes with the patient today, the majority of which was spent counseling the patient on the diagnosis of cancer and coordinating care.  Radene Gunning, M.D., Ph.D.

## 2013-02-13 NOTE — Addendum Note (Signed)
Encounter addended by: Jonna Coup, MD on: 02/13/2013  7:29 AM<BR>     Documentation filed: Notes Section

## 2013-02-14 ENCOUNTER — Other Ambulatory Visit: Payer: Self-pay | Admitting: Radiology

## 2013-02-17 ENCOUNTER — Ambulatory Visit
Admission: RE | Admit: 2013-02-17 | Discharge: 2013-02-17 | Disposition: A | Discharge status: Home / Self Care | Payer: Medicare Other | Source: Ambulatory Visit | Attending: Radiation Oncology | Admitting: Radiation Oncology

## 2013-02-17 ENCOUNTER — Telehealth (HOSPITAL_COMMUNITY): Payer: Self-pay | Admitting: Radiology

## 2013-02-17 DIAGNOSIS — C069 Malignant neoplasm of mouth, unspecified: Secondary | ICD-10-CM | POA: Insufficient documentation

## 2013-02-17 DIAGNOSIS — Y849 Medical procedure, unspecified as the cause of abnormal reaction of the patient, or of later complication, without mention of misadventure at the time of the procedure: Secondary | ICD-10-CM | POA: Insufficient documentation

## 2013-02-17 DIAGNOSIS — Z434 Encounter for attention to other artificial openings of digestive tract: Secondary | ICD-10-CM | POA: Insufficient documentation

## 2013-02-17 DIAGNOSIS — Z510 Encounter for antineoplastic radiation therapy: Secondary | ICD-10-CM | POA: Insufficient documentation

## 2013-02-17 DIAGNOSIS — C029 Malignant neoplasm of tongue, unspecified: Secondary | ICD-10-CM

## 2013-02-17 DIAGNOSIS — L538 Other specified erythematous conditions: Secondary | ICD-10-CM | POA: Insufficient documentation

## 2013-02-17 DIAGNOSIS — K121 Other forms of stomatitis: Secondary | ICD-10-CM | POA: Insufficient documentation

## 2013-02-17 DIAGNOSIS — T85698A Other mechanical complication of other specified internal prosthetic devices, implants and grafts, initial encounter: Secondary | ICD-10-CM | POA: Insufficient documentation

## 2013-02-17 NOTE — Telephone Encounter (Signed)
Called patient to remind of appointment at [facility] IR 02-18-13 arrive at 11:30, npo after midnight, have driver, drink barium tonight.

## 2013-02-18 ENCOUNTER — Ambulatory Visit (HOSPITAL_COMMUNITY)
Admission: RE | Admit: 2013-02-18 | Discharge: 2013-02-18 | Disposition: A | Discharge status: Home / Self Care | Payer: Medicare Other | Source: Ambulatory Visit | Attending: Oncology | Admitting: Oncology

## 2013-02-18 ENCOUNTER — Encounter (HOSPITAL_COMMUNITY): Payer: Self-pay

## 2013-02-18 DIAGNOSIS — Z87891 Personal history of nicotine dependence: Secondary | ICD-10-CM | POA: Insufficient documentation

## 2013-02-18 DIAGNOSIS — I10 Essential (primary) hypertension: Secondary | ICD-10-CM | POA: Insufficient documentation

## 2013-02-18 DIAGNOSIS — Z86718 Personal history of other venous thrombosis and embolism: Secondary | ICD-10-CM | POA: Insufficient documentation

## 2013-02-18 DIAGNOSIS — Z86711 Personal history of pulmonary embolism: Secondary | ICD-10-CM | POA: Insufficient documentation

## 2013-02-18 DIAGNOSIS — C029 Malignant neoplasm of tongue, unspecified: Secondary | ICD-10-CM

## 2013-02-18 DIAGNOSIS — S78119A Complete traumatic amputation at level between unspecified hip and knee, initial encounter: Secondary | ICD-10-CM | POA: Insufficient documentation

## 2013-02-18 LAB — BASIC METABOLIC PANEL
BUN: 52 mg/dL — ABNORMAL HIGH (ref 6–23)
Calcium: 9.4 mg/dL (ref 8.4–10.5)
GFR calc Af Amer: >90 mL/min (ref >90)
GFR calc non Af Amer: 78 mL/min — ABNORMAL LOW (ref >90)
Glucose, Bld: 97 mg/dL (ref 70–99)

## 2013-02-18 LAB — CBC
MCH: 30.2 pg (ref 26.0–34.0)
MCHC: 33.8 g/dL (ref 30.0–36.0)
Platelets: 205 10*3/uL (ref 150–400)
RDW: 12.5 % (ref 11.5–15.5)

## 2013-02-18 LAB — PROTIME-INR
INR: 0.92 (ref 0.00–1.49)
Prothrombin Time: 12.2 seconds (ref 11.6–15.2)

## 2013-02-18 MED ORDER — MIDAZOLAM HCL 2 MG/2ML IJ SOLN
INTRAMUSCULAR | Status: AC
  Filled 2013-02-18: qty 6

## 2013-02-18 MED ORDER — HYDROMORPHONE HCL PF 1 MG/ML IJ SOLN
INTRAMUSCULAR | Status: AC
  Administered 2013-02-18: 1 mg via INTRAVENOUS
  Filled 2013-02-18: qty 1

## 2013-02-18 MED ORDER — IOHEXOL 300 MG/ML  SOLN
25.0000 mL | Freq: Once | INTRAMUSCULAR | Status: AC | PRN
  Administered 2013-02-18: 25 mL

## 2013-02-18 MED ORDER — GLUCAGON HCL (RDNA) 1 MG IJ SOLR
INTRAMUSCULAR | Status: AC | PRN
  Administered 2013-02-18: .5 mg via INTRAVENOUS

## 2013-02-18 MED ORDER — HYDROMORPHONE HCL PF 2 MG/ML IJ SOLN: 1.0000 mg | INTRAMUSCULAR | Status: DC

## 2013-02-18 MED ORDER — CEFAZOLIN SODIUM-DEXTROSE 2-3 GM-% IV SOLR
2.0000 g | INTRAVENOUS | Status: AC
  Administered 2013-02-18: 2 g via INTRAVENOUS
  Filled 2013-02-18: qty 50

## 2013-02-18 MED ORDER — SODIUM CHLORIDE 0.9 % IV SOLN
INTRAVENOUS | Status: DC
  Administered 2013-02-18: 12:00:00 via INTRAVENOUS

## 2013-02-18 MED ORDER — MIDAZOLAM HCL 2 MG/2ML IJ SOLN
INTRAMUSCULAR | Status: DC | PRN
  Administered 2013-02-18: 1 mg via INTRAVENOUS

## 2013-02-18 MED ORDER — HYDROMORPHONE HCL PF 1 MG/ML IJ SOLN
INTRAMUSCULAR | Status: AC
  Administered 2013-02-18: 1 mg
  Filled 2013-02-18: qty 1

## 2013-02-18 MED ORDER — FENTANYL CITRATE 0.05 MG/ML IJ SOLN
INTRAMUSCULAR | Status: AC
  Filled 2013-02-18: qty 6

## 2013-02-18 MED ORDER — GLUCAGON HCL (RDNA) 1 MG IJ SOLR
INTRAMUSCULAR | Status: AC
  Filled 2013-02-18: qty 1

## 2013-02-18 MED ORDER — LIDOCAINE HCL 1 % IJ SOLN
INTRAMUSCULAR | Status: AC
  Filled 2013-02-18: qty 20

## 2013-02-18 MED ORDER — HYDROMORPHONE HCL PF 2 MG/ML IJ SOLN: 1.0000 mg | INTRAMUSCULAR | Status: DC | PRN

## 2013-02-18 MED ORDER — MIDAZOLAM HCL 2 MG/2ML IJ SOLN
INTRAMUSCULAR | Status: AC | PRN
  Administered 2013-02-18: 1 mg via INTRAVENOUS

## 2013-02-18 MED ORDER — FENTANYL CITRATE 0.05 MG/ML IJ SOLN
INTRAMUSCULAR | Status: AC | PRN
  Administered 2013-02-18: 100 ug via INTRAVENOUS

## 2013-02-18 NOTE — H&P (Signed)
Chief Complaint: "I'm here for a feeding tube" Referring Physician:Ha HPI: Brent [name] is an 62 y.o. 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 He had left neck dissection with negative margins but is to start radiation therapy soon. He has been able to eat and swallow solids but he is at risk for dysphagia from XRT and is here for G-tube placement. PMHx and meds reviewed. Pt feels well aside from some anxiety.  Past Medical History:  Past Medical History  Diagnosis Date  . Arthritis   . Tongue cancer   . DVT (deep venous thrombosis) 2006    associated with surgery  . PE (pulmonary embolism) 2006    associated with surgery  . Diverticulosis   . Hypertension     [facility] [place]  [place] Jules Husbands    Past Surgical History:  Past Surgical History  Procedure Laterality Date  . Knee surgery Left   . Above knee leg amputation Left 2006    Dr. Renae Fickle  . Colonoscopy  2011  . Direct laryngoscopy N/A 01/23/2013    Procedure: DIRECT LARYNGOSCOPY; RIGID BRONCHOSCOPY;  Surgeon: [name], MD;  Location: [facility] OR;  Service: ENT;  Laterality: N/A;  . Glossectomy Left 01/23/2013    Procedure: HEMIGLOSSECTOMY;  Surgeon: [name], MD;  Location: [facility] OR;  Service: ENT;  Laterality: Left;  . Radical neck dissection Left 01/23/2013    Procedure: LEFT SELECTIVE NECK DISSECTION;  Surgeon: [name], MD;  Location: [facility] OR;  Service: ENT;  Laterality: Left;  . Esophagoscopy N/A 01/23/2013    Procedure: ESOPHAGOSCOPY;  Surgeon: [name], MD;  Location[facility] OR;  Service: ENT;  Laterality: N/A;    Family History:  Family History  Problem Relation Age of Onset  . Prostate cancer    . Asthma Son   . Arthritis/Rheumatoid Mother   . Colon cancer Father 22    Social History:  reports that he quit smoking about 8 weeks ago. He has never used smokeless tobacco. He reports that  drinks alcohol. He reports that he does not use illicit drugs.  Allergies: No Known Allergies  Medications:   Medication List    ASK your doctor about  these medications       ALPRAZolam 0.5 MG tablet  Commonly known as:  XANAX  Take 0.5 mg by mouth daily as needed for anxiety.     cyclobenzaprine 10 MG tablet  Commonly known as:  FLEXERIL  Take 10 mg by mouth daily as needed for muscle spasms.     docusate sodium 100 MG capsule  Commonly known as:  COLACE  Take 100 mg by mouth daily as needed for constipation.     ibuprofen 400 MG tablet  Commonly known as:  ADVIL,MOTRIN  Take 1 tablet (400 mg total) by mouth every 6 (six) hours as needed.     lisinopril 20 MG tablet  Commonly known as:  PRINIVIL,ZESTRIL  Take 20 mg by mouth every morning.     LORazepam 0.5 MG tablet  Commonly known as:  ATIVAN  Take 1 tablet (0.5 mg total) by mouth every 6 (six) hours as needed (Nausea or vomiting).     morphine 100 MG 12 hr tablet  Commonly known as:  MS CONTIN  100 mg 2 (two) times daily.     multivitamin with minerals Tabs  Take 1 tablet by mouth daily.     ondansetron 8 MG tablet  Commonly known as:  ZOFRAN  Take 1 tablet (8 mg total) by mouth every  12 (twelve) hours as needed. Take two times a day as needed for nausea or vomiting starting on the third day after chemotherapy.     oxycodone 30 MG immediate release tablet  Commonly known as:  ROXICODONE  Take 30 mg by mouth every 6 (six) hours as needed for pain. Take 1-3 tabs q 6 hrs prn pain     prochlorperazine 10 MG tablet  Commonly known as:  COMPAZINE  Take 1 tablet (10 mg total) by mouth every 6 (six) hours as needed (Nausea or vomiting).     simvastatin 10 MG tablet  Commonly known as:  ZOCOR  Take 10 mg by mouth at bedtime.        Please HPI for pertinent positives, otherwise complete 10 system ROS negative.  Physical Exam: Temp: 98.0, BP: 108/62, RR: 18, HR: 81, O2: 96%   General Appearance:  Alert, cooperative, no distress, appears stated age  Head:  Normocephalic, without obvious abnormality, atraumatic  ENT: Unremarkable  Neck: Supple, healed surgical  scars  Lungs:   Clear to auscultation bilaterally, no w/r/r, respirations unlabored without use of accessory muscles.  Chest Wall:  No tenderness or deformity  Heart:  Regular rate and rhythm, S1, S2 normal, no murmur, rub or gallop.  Abdomen:   Soft, non-tender, non distended.  Neurologic: Normal affect, no gross deficits.   Results for orders placed during the hospital encounter of 02/18/13 (from the past 48 hour(s))  APTT     Status: None   Collection Time    02/18/13 12:00 PM      Result Value Range   aPTT 32  24 - 37 seconds  BASIC METABOLIC PANEL     Status: Abnormal   Collection Time    02/18/13 12:00 PM      Result Value Range   Sodium 136  135 - 145 mEq/L   Potassium 4.6  3.5 - 5.1 mEq/L   Chloride 100  96 - 112 mEq/L   CO2 27  19 - 32 mEq/L   Glucose, Bld 97  70 - 99 mg/dL   BUN 52 (*) 6 - 23 mg/dL   Creatinine, Ser 4.09  0.50 - 1.35 mg/dL   Calcium 9.4  8.4 - 81.1 mg/dL   GFR calc non Af Amer 78 (*) >90 mL/min   GFR calc Af Amer >90  >90 mL/min   Comment:            The eGFR has been calculated     using the CKD EPI equation.     This calculation has not been     validated in all clinical     situations.     eGFR's persistently     <90 mL/min signify     possible Chronic Kidney Disease.  CBC     Status: Abnormal   Collection Time    02/18/13 12:00 PM      Result Value Range   WBC 4.8  4.0 - 10.5 K/uL   RBC 3.18 (*) 4.22 - 5.81 MIL/uL   Hemoglobin 9.6 (*) 13.0 - 17.0 g/dL   HCT 91.4 (*) 78.2 - 95.6 %   MCV 89.3  78.0 - 100.0 fL   MCH 30.2  26.0 - 34.0 pg   MCHC 33.8  30.0 - 36.0 g/dL   RDW 21.3  08.6 - 57.8 %   Platelets 205  150 - 400 K/uL  PROTIME-INR     Status: None   Collection Time    02/18/13  12:00 PM      Result Value Range   Prothrombin Time 12.2  11.6 - 15.2 seconds   INR 0.92  0.00 - 1.49   No results found.  Assessment/Plan Tongue cancer For Gastrostomy tube placement. Drank his barium last pm. Discussed procedure, risks,  complications, use of sedation. Labs reviewed, ok Consent signed in chart  Brayton El PA-C 02/18/2013, 12:49 PM

## 2013-02-18 NOTE — Procedures (Signed)
20 Fr pull through G tube No comp 

## 2013-02-18 NOTE — Progress Notes (Signed)
  [facility] in to see patient for home care instructions

## 2013-02-20 ENCOUNTER — Other Ambulatory Visit (HOSPITAL_BASED_OUTPATIENT_CLINIC_OR_DEPARTMENT_OTHER): Payer: Medicare Other | Admitting: Lab

## 2013-02-20 ENCOUNTER — Telehealth: Payer: Self-pay | Admitting: Hematology and Oncology

## 2013-02-20 DIAGNOSIS — C029 Malignant neoplasm of tongue, unspecified: Secondary | ICD-10-CM

## 2013-02-20 LAB — CBC WITH DIFFERENTIAL/PLATELET
Eosinophils Absolute: 0.5 10*3/uL (ref 0.0–0.5)
MCV: 91.3 fL (ref 79.3–98.0)
MONO#: 0.7 10*3/uL (ref 0.1–0.9)
MONO%: 11.6 % (ref 0.0–14.0)
NEUT#: 3.9 10*3/uL (ref 1.5–6.5)
RBC: 3.23 10*6/uL — ABNORMAL LOW (ref 4.20–5.82)
RDW: 12.8 % (ref 11.0–14.6)
WBC: 6.1 10*3/uL (ref 4.0–10.3)
lymph#: 1 10*3/uL (ref 0.9–3.3)
nRBC: 0 % (ref 0–0)

## 2013-02-20 LAB — BASIC METABOLIC PANEL (CC13)
CO2: 27 mEq/L (ref 22–29)
Calcium: 9.6 mg/dL (ref 8.4–10.4)
Chloride: 102 mEq/L (ref 98–109)
Sodium: 137 mEq/L (ref 136–145)

## 2013-02-20 NOTE — Telephone Encounter (Signed)
Returned wife's call re scheduling questions. lmonvm asking that wife call me back at her convenience.

## 2013-02-21 ENCOUNTER — Ambulatory Visit (HOSPITAL_BASED_OUTPATIENT_CLINIC_OR_DEPARTMENT_OTHER): Payer: Medicare Other

## 2013-02-21 ENCOUNTER — Ambulatory Visit: Payer: Medicare Other | Admitting: Nutrition

## 2013-02-21 VITALS — BP 104/60 | HR 96 | Temp 98.5°F | Resp 17

## 2013-02-21 DIAGNOSIS — C029 Malignant neoplasm of tongue, unspecified: Secondary | ICD-10-CM

## 2013-02-21 DIAGNOSIS — Z5111 Encounter for antineoplastic chemotherapy: Secondary | ICD-10-CM

## 2013-02-21 MED ORDER — DEXAMETHASONE SODIUM PHOSPHATE 20 MG/5ML IJ SOLN
12.0000 mg | Freq: Once | INTRAMUSCULAR | Status: AC
  Administered 2013-02-21: 12 mg via INTRAVENOUS

## 2013-02-21 MED ORDER — PALONOSETRON HCL INJECTION 0.25 MG/5ML
0.2500 mg | Freq: Once | INTRAVENOUS | Status: AC
  Administered 2013-02-21: 0.25 mg via INTRAVENOUS

## 2013-02-21 MED ORDER — SODIUM CHLORIDE 0.9 % IV SOLN
150.0000 mg | Freq: Once | INTRAVENOUS | Status: AC
  Administered 2013-02-21: 150 mg via INTRAVENOUS
  Filled 2013-02-21: qty 5

## 2013-02-21 MED ORDER — SODIUM CHLORIDE 0.9 % IV SOLN
100.0000 mg/m2 | Freq: Once | INTRAVENOUS | Status: AC
  Administered 2013-02-21: 178 mg via INTRAVENOUS
  Filled 2013-02-21: qty 178

## 2013-02-21 MED ORDER — POTASSIUM CHLORIDE 2 MEQ/ML IV SOLN
Freq: Once | INTRAVENOUS | Status: AC
  Administered 2013-02-21: 09:00:00 via INTRAVENOUS
  Filled 2013-02-21: qty 10

## 2013-02-21 NOTE — Progress Notes (Signed)
This is a 62 year old male patient diagnosed with tongue cancer status post G-tube placement on Tuesday.  Past medical history includes, DVT, diverticulitis, and hypertension.  Medications include Xanax, Flexeril, Colace, Ativan, multivitamin, Zofran, Compazine, and Zocor.  Labs include BUN of 52, creatinine 1.0 on July 29.  Height: 68 inches. Weight: 151.3 pounds July 23. Usual body weight: Patient reports usual body weight is 150 pounds.  Note body weight of 127 pounds July 5. BMI: 23.01.  Estimated nutrition needs: 2063-2380 calories, 103-117 g protein, 2.3 L fluid.  Patient reports he is in a lot of pain from recent G-tube placement.  He states he cannot sleep at night secondary to this pain.  He reports he has been eating fine and has no plans on using G-tube nutrition or hydration.  He has been drinking whey protein mixed with whole milk twice a day and eating often throughout the day consuming higher calories, higher protein foods.  This has allowed him to put on approximately 24 pounds over the last 3 weeks.  Patient reports he will not start radiation therapy until next week.  Nutrition diagnosis: Predicted suboptimal energy intake related to diagnosis of tongue cancer and associated treatments as evidenced by the history or presence of a condition for which research shows an increased incidence of suboptimal energy intake.  Intervention: I educated patient on the importance of continuing high-calorie, high-protein foods, and frequent small meals throughout the day.  I educated him on strategies for eating if he develops nausea, vomiting, or diarrhea/constipation.  I educated him on the importance of adequate hydration throughout treatment.  I introduced the idea of using feeding tube for water if his ability to drink adequate hydration is diminished.  I educated patient that we would like for him to continue to eat foods as long as possible, but that his feeding tube was placed in case  he was unable to maintain his nutrition status.  Patient was provided with fact sheets and contact information.  Monitoring, evaluation, goals: Patient will tolerate adequate calories and protein along with oral nutrition supplements to promote minimal weight loss.  Tube feedings will be initiated when patient loses 5% or more of usual body weight.  Next visit: Friday, August 22, during chemotherapy.

## 2013-02-21 NOTE — Progress Notes (Signed)
Pt voided 390 cc urine.  Total output 1665.

## 2013-02-21 NOTE — Patient Instructions (Addendum)
[facility] Discharge Instructions for Patients Receiving Chemotherapy  Today you received the following chemotherapy agents Cisplatin.  To help prevent nausea and vomiting after your treatment, we encourage you to take your nausea medication as directed.    If you develop nausea and vomiting that is not controlled by your nausea medication, call the clinic.   BELOW ARE SYMPTOMS THAT SHOULD BE REPORTED IMMEDIATELY:  *FEVER GREATER THAN 100.5 F  *CHILLS WITH OR WITHOUT FEVER  NAUSEA AND VOMITING THAT IS NOT CONTROLLED WITH YOUR NAUSEA MEDICATION  *UNUSUAL SHORTNESS OF BREATH  *UNUSUAL BRUISING OR BLEEDING  TENDERNESS IN MOUTH AND THROAT WITH OR WITHOUT PRESENCE OF ULCERS  *URINARY PROBLEMS  *BOWEL PROBLEMS  UNUSUAL RASH Items with * indicate a potential emergency and should be followed up as soon as possible.  Feel free to call the clinic you have any questions or concerns. The clinic phone number is [phone].    

## 2013-02-24 ENCOUNTER — Other Ambulatory Visit (HOSPITAL_COMMUNITY): Payer: Self-pay | Admitting: Interventional Radiology

## 2013-02-24 ENCOUNTER — Telehealth: Payer: Self-pay | Admitting: *Deleted

## 2013-02-24 ENCOUNTER — Ambulatory Visit (HOSPITAL_COMMUNITY)
Admission: RE | Admit: 2013-02-24 | Discharge: 2013-02-24 | Disposition: A | Discharge status: Home / Self Care | Payer: Medicare Other | Source: Ambulatory Visit | Attending: Interventional Radiology | Admitting: Interventional Radiology

## 2013-02-24 DIAGNOSIS — R52 Pain, unspecified: Secondary | ICD-10-CM

## 2013-02-24 NOTE — Telephone Encounter (Signed)
Message copied by Sabino Snipes on Mon Feb 24, 2013  3:47 PM ------      Message from: GARNER, [place] P      Created: Fri Feb 21, 2013  3:04 PM      Regarding: 1st time cisplatin       1st time cisplatin- tolerated well. No reaction. Encouraged fluids.      Pt of provider taking over for Dr. Gaylyn Rong       ------

## 2013-02-24 NOTE — Progress Notes (Signed)
Pt comes in about 5 days post G-tube placement. Having pain, swelling, redness at site. No fevers, no N/V.  G-tube site with obvious erythema and swelling and tenderness. Bumper is very tight at about 2.5cm mark. Bumper relaxed and immediate return of purulent drainage. Pt did feel relief after more drainage expressed. Sterile gauze dressing applied and bumper secured at about 3.5cm mark with gauze under.  Advised to expect drainage and keep clean dressing in place. Rx for Septra DS given BID X 7 days.  Pt will return on Wed 8/6 for re-eval.  Brayton El PA-C Interventional Radiology 02/24/2013 12:54 PM

## 2013-02-24 NOTE — Telephone Encounter (Signed)
Called & spoke with pt regarding his cisplatin treatment on fri 02/21/13.  He states he feels almost normal with no problems other than some constipation from his pain med & his main concern was that he was having some pain from his feeding tube from the beginning of insertion.  He had that looked at today & was told that it was put in too tight & he had some infection starting & was placed on an antibiotic.  It feels better at this time.  He knows he can call back with any problems or concerns.

## 2013-02-26 ENCOUNTER — Other Ambulatory Visit: Payer: Self-pay | Admitting: Radiation Oncology

## 2013-02-26 ENCOUNTER — Other Ambulatory Visit (HOSPITAL_COMMUNITY): Payer: Self-pay | Admitting: Interventional Radiology

## 2013-02-26 ENCOUNTER — Ambulatory Visit: Admission: RE | Admit: 2013-02-26 | Payer: Medicare Other | Source: Ambulatory Visit | Admitting: Radiation Oncology

## 2013-02-26 ENCOUNTER — Ambulatory Visit (HOSPITAL_COMMUNITY)
Admission: RE | Admit: 2013-02-26 | Discharge: 2013-02-26 | Disposition: A | Discharge status: Home / Self Care | Payer: Medicare Other | Source: Ambulatory Visit | Attending: Interventional Radiology | Admitting: Interventional Radiology

## 2013-02-26 DIAGNOSIS — K9423 Gastrostomy malfunction: Secondary | ICD-10-CM

## 2013-02-26 DIAGNOSIS — C029 Malignant neoplasm of tongue, unspecified: Secondary | ICD-10-CM

## 2013-02-26 NOTE — Progress Notes (Signed)
Patient ID: Brent [name], male   DOB: [birth year], 63 y.o.   MRN: [id number]    Percutaneous gastric tube placed 02/18/13 Had been seen 8/4 regarding redness at site and leakage around tube See note 8/4  Pt now on day #2 Septra DS 7 day course Feels better today Much less reddened skin Only small amt leakage today- nonpurulent  Loosened bumper to evaluate G tube and re placed to 4 cm marker Non tender- no leakage noted  Pt still complains of some bloating in stomach/abd He is to see PCP today at 1230pm  Rec: continue and finish course of SeptraS Call us if need [place]

## 2013-02-27 ENCOUNTER — Encounter (HOSPITAL_COMMUNITY): Payer: Self-pay

## 2013-02-27 ENCOUNTER — Other Ambulatory Visit (HOSPITAL_COMMUNITY): Payer: Self-pay | Admitting: Interventional Radiology

## 2013-02-27 ENCOUNTER — Ambulatory Visit (HOSPITAL_COMMUNITY)
Admission: RE | Admit: 2013-02-27 | Discharge: 2013-02-27 | Disposition: A | Discharge status: Home / Self Care | Payer: Medicare Other | Source: Ambulatory Visit | Attending: Interventional Radiology | Admitting: Interventional Radiology

## 2013-02-27 ENCOUNTER — Ambulatory Visit (HOSPITAL_COMMUNITY)
Admission: RE | Admit: 2013-02-27 | Discharge: 2013-02-27 | Disposition: A | Discharge status: Home / Self Care | Payer: Medicare Other | Source: Ambulatory Visit | Attending: Radiology | Admitting: Radiology

## 2013-02-27 ENCOUNTER — Ambulatory Visit: Admission: RE | Admit: 2013-02-27 | Payer: Medicare Other | Source: Ambulatory Visit

## 2013-02-27 DIAGNOSIS — Y833 Surgical operation with formation of external stoma as the cause of abnormal reaction of the patient, or of later complication, without mention of misadventure at the time of the procedure: Secondary | ICD-10-CM | POA: Insufficient documentation

## 2013-02-27 DIAGNOSIS — T85698A Other mechanical complication of other specified internal prosthetic devices, implants and grafts, initial encounter: Secondary | ICD-10-CM | POA: Insufficient documentation

## 2013-02-27 DIAGNOSIS — R109 Unspecified abdominal pain: Secondary | ICD-10-CM | POA: Insufficient documentation

## 2013-02-27 DIAGNOSIS — R633 Feeding difficulties: Secondary | ICD-10-CM

## 2013-02-27 DIAGNOSIS — Z431 Encounter for attention to gastrostomy: Secondary | ICD-10-CM | POA: Insufficient documentation

## 2013-02-27 DIAGNOSIS — Y9210 Unspecified residential institution as the place of occurrence of the external cause: Secondary | ICD-10-CM | POA: Insufficient documentation

## 2013-02-27 DIAGNOSIS — W010XXA Fall on same level from slipping, tripping and stumbling without subsequent striking against object, initial encounter: Secondary | ICD-10-CM | POA: Insufficient documentation

## 2013-02-27 MED ORDER — IOHEXOL 300 MG/ML  SOLN
100.0000 mL | Freq: Once | INTRAMUSCULAR | Status: AC | PRN
  Administered 2013-02-27: 100 mL via INTRAVENOUS

## 2013-02-27 MED ORDER — IOHEXOL 300 MG/ML  SOLN
10.0000 mL | Freq: Once | INTRAMUSCULAR | Status: AC | PRN
  Administered 2013-02-27: 1 mL

## 2013-02-27 MED ORDER — LORAZEPAM 2 MG/ML IJ SOLN: 1.0000 mg | Freq: Once | INTRAMUSCULAR | Status: DC

## 2013-02-27 MED ORDER — DIAZEPAM 5 MG/ML IJ SOLN
INTRAMUSCULAR | Status: AC
  Filled 2013-02-27: qty 2

## 2013-02-27 MED ORDER — DIAZEPAM 5 MG/ML IJ SOLN
5.0000 mg | Freq: Once | INTRAMUSCULAR | Status: AC
  Administered 2013-02-27: 5 mg via INTRAVENOUS

## 2013-02-27 NOTE — Progress Notes (Signed)
Patient was escorted out myself anda  family member. He tripped and fell in the back hall of radiology.  He wears a prosthetic on is left leg that he states has become lose due to his rapid weight loss.  He has "stumbled" and fallen due to this at other times.  He was helped up and put in a wheelchair although he did not want to be wheeled in one.  He denied any soreness or other complains.  He did not want any further evaluation and wanted to go home. He was told to call us if he had any problems or concerns.  His family appeared to fine with this and I escorted them to the front entrance to wait for his ride home.

## 2013-02-27 NOTE — Procedures (Signed)
Appropriately positioned and functioning gastrostomy tube.  No exchange performed.

## 2013-02-28 ENCOUNTER — Ambulatory Visit: Payer: Medicare Other

## 2013-02-28 NOTE — Addendum Note (Signed)
Encounter addended by: Bernell List on: 02/28/2013 12:27 PM<BR>     Documentation filed: Charges VN

## 2013-03-03 ENCOUNTER — Ambulatory Visit: Payer: Medicare Other

## 2013-03-03 ENCOUNTER — Ambulatory Visit: Payer: Medicare Other | Attending: Oncology

## 2013-03-03 ENCOUNTER — Ambulatory Visit
Admission: RE | Admit: 2013-03-03 | Discharge: 2013-03-03 | Disposition: A | Discharge status: Home / Self Care | Payer: Medicare Other | Source: Ambulatory Visit | Attending: Radiation Oncology | Admitting: Radiation Oncology

## 2013-03-03 DIAGNOSIS — IMO0001 Reserved for inherently not codable concepts without codable children: Secondary | ICD-10-CM | POA: Insufficient documentation

## 2013-03-03 DIAGNOSIS — R1310 Dysphagia, unspecified: Secondary | ICD-10-CM | POA: Insufficient documentation

## 2013-03-03 DIAGNOSIS — C029 Malignant neoplasm of tongue, unspecified: Secondary | ICD-10-CM | POA: Insufficient documentation

## 2013-03-03 DIAGNOSIS — Z931 Gastrostomy status: Secondary | ICD-10-CM | POA: Insufficient documentation

## 2013-03-03 NOTE — Progress Notes (Addendum)
Was the fall witnessed: yes  Patient condition before and after the fall: Pt was ambulatory and oriented.  Patient's reaction to the fall: Denied any injury  Name of the doctor that was notified including date and time: no doctor notified. Pt and daughter refused  Any interventions and vital signs: no Collene Gobble entered per direction from [facility].

## 2013-03-04 ENCOUNTER — Ambulatory Visit
Admission: RE | Admit: 2013-03-04 | Discharge: 2013-03-04 | Disposition: A | Discharge status: Home / Self Care | Payer: Medicare Other | Source: Ambulatory Visit | Attending: Radiation Oncology | Admitting: Radiation Oncology

## 2013-03-04 ENCOUNTER — Other Ambulatory Visit (HOSPITAL_COMMUNITY): Payer: Self-pay | Admitting: Interventional Radiology

## 2013-03-04 ENCOUNTER — Telehealth: Payer: Self-pay | Admitting: *Deleted

## 2013-03-04 ENCOUNTER — Ambulatory Visit (HOSPITAL_COMMUNITY)
Admission: RE | Admit: 2013-03-04 | Discharge: 2013-03-04 | Disposition: A | Discharge status: Home / Self Care | Payer: Medicare Other | Source: Ambulatory Visit | Attending: Interventional Radiology | Admitting: Interventional Radiology

## 2013-03-04 DIAGNOSIS — C801 Malignant (primary) neoplasm, unspecified: Secondary | ICD-10-CM

## 2013-03-04 DIAGNOSIS — Z431 Encounter for attention to gastrostomy: Secondary | ICD-10-CM | POA: Insufficient documentation

## 2013-03-04 DIAGNOSIS — C029 Malignant neoplasm of tongue, unspecified: Secondary | ICD-10-CM | POA: Insufficient documentation

## 2013-03-04 MED ORDER — IOHEXOL 300 MG/ML  SOLN
50.0000 mL | Freq: Once | INTRAMUSCULAR | Status: AC | PRN
  Administered 2013-03-04: 25 mL

## 2013-03-04 NOTE — Telephone Encounter (Signed)
Received called from pt spouse who indicated PEG tube is "leaking, skin is eroding".  I arranged for pt to be seen by IR today at 1500 prior to his 1545 IMRT appt; called and spoke with pt's spouse who indicated understanding.

## 2013-03-05 ENCOUNTER — Other Ambulatory Visit (HOSPITAL_COMMUNITY): Payer: Self-pay | Admitting: Diagnostic Radiology

## 2013-03-05 ENCOUNTER — Ambulatory Visit
Admission: RE | Admit: 2013-03-05 | Discharge: 2013-03-05 | Disposition: A | Discharge status: Home / Self Care | Payer: Medicare Other | Source: Ambulatory Visit | Attending: Radiation Oncology | Admitting: Radiation Oncology

## 2013-03-05 ENCOUNTER — Other Ambulatory Visit: Payer: Self-pay | Admitting: *Deleted

## 2013-03-05 ENCOUNTER — Telehealth: Payer: Self-pay | Admitting: Oncology

## 2013-03-05 ENCOUNTER — Other Ambulatory Visit: Payer: Self-pay | Admitting: Diagnostic Radiology

## 2013-03-05 VITALS — BP 102/55 | HR 74 | Temp 98.0°F | Ht 68.0 in | Wt 144.5 lb

## 2013-03-05 DIAGNOSIS — IMO0002 Reserved for concepts with insufficient information to code with codable children: Secondary | ICD-10-CM

## 2013-03-05 DIAGNOSIS — C029 Malignant neoplasm of tongue, unspecified: Secondary | ICD-10-CM

## 2013-03-05 MED ORDER — RADIAPLEXRX EX GEL
Freq: Once | CUTANEOUS | Status: AC
  Administered 2013-03-05: 14:00:00 via TOPICAL

## 2013-03-05 NOTE — Addendum Note (Signed)
Encounter addended by: Laurynn Mccorvey R Bhavin Monjaraz, RN on: 03/05/2013  2:12 PM<BR>     Documentation filed: Orders

## 2013-03-05 NOTE — Addendum Note (Signed)
Encounter addended by: Eduardo Osier, RN on: 03/05/2013  2:14 PM<BR>     Documentation filed: Inpatient MAR

## 2013-03-05 NOTE — Progress Notes (Signed)
  Radiation Oncology         [phone]) [phone] ________________________________  Name: Brent [name] MRN: [id number]  Date: 02/17/2013  DOB: 05-18-1951  SIMULATION AND TREATMENT PLANNING NOTE   CONSENT VERIFIED: yes   SET UP: Patient is set-up supine   IMMOBILIZATION: The following immobilization is used:Aquaplast Mask. This complex treatment device will be used on a daily basis during the patient's treatment.  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  Identity was confirmed.  All relevant records and images related to the planned course of therapy were reviewed.  Then, the patient was positioned in a stable reproducible clinical set-up for radiation therapy using an aquaplast mask.  Skin markings were placed.  The CT images were loaded into the planning software where the target and avoidance structures were contoured.The radiation prescription was entered and confirmed.  The patient will receive 60 Gy in 30 fractions.  Daily image guidance is ordered, and this will be used on a daily basis. This is necessary to ensure accurate and precise localization of the target in addition to accurate alignment of the normal tissue structures in this region.  Treatment planning then occurred.  I have requested : Intensity Modulated Radiotherapy (IMRT) is medically necessary for this case for the following reason:  Dose homogeneity and treatment of a head and neck site. The target is in close proximity to critical normal structures, as well as the parotid glands which may negatively impact the patient's quality of life if not maximally spared given the constraints of the overall treatment plan. IMRT is thus medically necessary to appropriately treat the patient.     ________________________________   Radene Gunning, MD, PhD

## 2013-03-05 NOTE — Progress Notes (Signed)
Brent [name] here with weekly under teat visit.  He has had 3 fractions to his tongue/bil neck.  He denies pain, dry mouth and fatigue.  He has been having trouble with his peg tube with it leaking and having his stomach swell due to constipation.  He is scheduled to have a J tube put in tomorrow.  He would like to wait a few days because he said that he has not had any leaking around his tube since yesterday.  On inspection, his peg tube dressing is intact without any drainage.  He is eating by mouth currently.  He has lost 7 lb since 02/12/2013.  He was given the Radiation Therapy and You book and discussed the potential side effects including fatigue, mouth changes, skin changes and throat changes.  He was given radiaplex gel and was instructed to apply it tiwce a day after treatment and at bedtime.

## 2013-03-05 NOTE — Telephone Encounter (Signed)
Called and spoke to [facility] in Interventional Radiology.  Let her know that Brent [name] J tube placement will be put on hold per Dr. Mitzi Hansen due to his peg tube working today.

## 2013-03-05 NOTE — Progress Notes (Signed)
Department of Radiation Oncology  Phone:  [phone] Fax:        [phone]  Weekly Treatment Note    Name: Brent [name] Date: 03/05/2013 MRN: [id number] DOB: 1950/12/15   Current dose: 6 Gy  Current fraction: 3   MEDICATIONS: Current Outpatient Prescriptions  Medication Sig Dispense Refill  . ALPRAZolam (XANAX) 0.5 MG tablet Take 0.5 mg by mouth daily as needed for anxiety.      . cyclobenzaprine (FLEXERIL) 10 MG tablet Take 10 mg by mouth daily as needed for muscle spasms.       Marland Kitchen docusate sodium (COLACE) 100 MG capsule Take 100 mg by mouth daily as needed for constipation.      Marland Kitchen ibuprofen (ADVIL,MOTRIN) 400 MG tablet Take 1 tablet (400 mg total) by mouth every 6 (six) hours as needed.  30 tablet  0  . lisinopril (PRINIVIL,ZESTRIL) 20 MG tablet Take 20 mg by mouth every morning.       Marland Kitchen LORazepam (ATIVAN) 0.5 MG tablet Take 1 tablet (0.5 mg total) by mouth every 6 (six) hours as needed (Nausea or vomiting).  30 tablet  1  . morphine (MS CONTIN) 100 MG 12 hr tablet 100 mg 2 (two) times daily.       . Multiple Vitamin (MULTIVITAMIN WITH MINERALS) TABS Take 1 tablet by mouth daily.      Marland Kitchen oxycodone (ROXICODONE) 30 MG immediate release tablet Take 30 mg by mouth every 6 (six) hours as needed for pain. Take 1-3 tabs q 6 hrs prn pain      . prochlorperazine (COMPAZINE) 10 MG tablet Take 1 tablet (10 mg total) by mouth every 6 (six) hours as needed (Nausea or vomiting).  30 tablet  1  . simvastatin (ZOCOR) 10 MG tablet Take 10 mg by mouth at bedtime.      . ondansetron (ZOFRAN) 8 MG tablet Take 1 tablet (8 mg total) by mouth every 12 (twelve) hours as needed. Take two times a day as needed for nausea or vomiting starting on the third day after chemotherapy.  30 tablet  1   No current facility-administered medications for this encounter.     ALLERGIES: Review of patient's allergies indicates no known allergies.   LABORATORY DATA:  Lab Results  Component Value Date   WBC  6.1 02/20/2013   HGB 9.7 Repeated and Verified* 02/20/2013   HCT 29.5* 02/20/2013   MCV 91.3 02/20/2013   PLT 219 02/20/2013   Lab Results  Component Value Date   NA 137 02/20/2013   K 4.9 02/20/2013   CL 100 02/18/2013   CO2 27 02/20/2013   Lab Results  Component Value Date   ALT 40 01/10/2013   AST 36* 01/10/2013   ALKPHOS 62 01/10/2013   BILITOT 0.33 01/10/2013     NARRATIVE: Brent [name] was seen today for weekly treatment management. The chart was checked and the patient's films were reviewed. The patient is doing fine in his first week of treatment. No complaints. He has lost some weight since several weeks ago. The patient was having some difficulty with his feeding tube with some leakage. He states that this has resolved over the last day. He was scheduled to have a J tube placed tomorrow but he would like to hold off on this since his original problems have resolved after some repositioning.  PHYSICAL EXAMINATION: height is 5\' 8"  (1.727 m) and weight is 144 lb 8 oz (65.545 kg). His temperature is 98 F (36.7  C). His blood pressure is 102/55 and his pulse is 74. His oxygen saturation is 94%.        ASSESSMENT: The patient is doing satisfactorily with treatment.  PLAN: We will continue with the patient's radiation treatment as planned. We will let interventional radiology no that he would like to cancel this appointment tomorrow. We will have this rescheduled if necessary.

## 2013-03-06 ENCOUNTER — Ambulatory Visit
Admission: RE | Admit: 2013-03-06 | Discharge: 2013-03-06 | Disposition: A | Discharge status: Home / Self Care | Payer: Medicare Other | Source: Ambulatory Visit | Attending: Radiation Oncology | Admitting: Radiation Oncology

## 2013-03-06 ENCOUNTER — Encounter: Payer: Self-pay | Admitting: Nutrition

## 2013-03-06 ENCOUNTER — Encounter: Payer: Self-pay | Admitting: *Deleted

## 2013-03-06 ENCOUNTER — Other Ambulatory Visit (HOSPITAL_COMMUNITY): Payer: Self-pay | Admitting: Interventional Radiology

## 2013-03-06 ENCOUNTER — Other Ambulatory Visit (HOSPITAL_COMMUNITY): Payer: Self-pay

## 2013-03-06 ENCOUNTER — Telehealth: Payer: Self-pay | Admitting: Hematology and Oncology

## 2013-03-06 DIAGNOSIS — C801 Malignant (primary) neoplasm, unspecified: Secondary | ICD-10-CM

## 2013-03-06 NOTE — Progress Notes (Signed)
Patient's wife called and cancelled nutrition appointment this afternoon.  She said patient's G tube was functioning well and he would not be changing to a Jejunostomy tube.  Patient did not need to begin feedings at this time and she would contact me when they need a follow up appointment.

## 2013-03-06 NOTE — Progress Notes (Signed)
Called pt's home to check on status of PEG and pt's cancellation of IR consult this morning.  LVM.  Will try to see pt during today's scheduled appt with Nutritionist or IMRT.  Young Berry, RN, BSN, [facility] Neck Oncology Navigator [phone]

## 2013-03-06 NOTE — Telephone Encounter (Signed)
s.w. pt wife and advised that 8.25.14 cx and r/s to 8.22.14 b4 tx.;;ok and awre

## 2013-03-07 ENCOUNTER — Ambulatory Visit
Admission: RE | Admit: 2013-03-07 | Discharge: 2013-03-07 | Disposition: A | Discharge status: Home / Self Care | Payer: Medicare Other | Source: Ambulatory Visit | Attending: Radiation Oncology | Admitting: Radiation Oncology

## 2013-03-10 ENCOUNTER — Ambulatory Visit
Admission: RE | Admit: 2013-03-10 | Discharge: 2013-03-10 | Disposition: A | Discharge status: Home / Self Care | Payer: Medicare Other | Source: Ambulatory Visit | Attending: Radiation Oncology | Admitting: Radiation Oncology

## 2013-03-11 ENCOUNTER — Ambulatory Visit
Admission: RE | Admit: 2013-03-11 | Discharge: 2013-03-11 | Disposition: A | Discharge status: Home / Self Care | Payer: Medicare Other | Source: Ambulatory Visit | Attending: Radiation Oncology | Admitting: Radiation Oncology

## 2013-03-11 VITALS — BP 97/60 | HR 63 | Temp 98.7°F | Resp 20 | Wt 146.6 lb

## 2013-03-11 DIAGNOSIS — C029 Malignant neoplasm of tongue, unspecified: Secondary | ICD-10-CM

## 2013-03-11 NOTE — Progress Notes (Addendum)
Pt denies pain, fatigue, loss of appetite. He has feeding tube but is not using at this time. He states he is eating any foods he likes.

## 2013-03-11 NOTE — Progress Notes (Signed)
   Department of Radiation Oncology  Phone:  [phone] Fax:        [phone]  Weekly Treatment Note    Name: Brent [name] Date: 03/11/2013 MRN: [id number] DOB: 03/15/1951   Current dose: 14 Gy  Current fraction: 7   MEDICATIONS: Current Outpatient Prescriptions  Medication Sig Dispense Refill  . ALPRAZolam (XANAX) 0.5 MG tablet Take 0.5 mg by mouth daily as needed for anxiety.      . cyclobenzaprine (FLEXERIL) 10 MG tablet Take 10 mg by mouth daily as needed for muscle spasms.       Marland Kitchen docusate sodium (COLACE) 100 MG capsule Take 100 mg by mouth daily as needed for constipation.      Marland Kitchen ibuprofen (ADVIL,MOTRIN) 400 MG tablet Take 1 tablet (400 mg total) by mouth every 6 (six) hours as needed.  30 tablet  0  . lisinopril (PRINIVIL,ZESTRIL) 20 MG tablet Take 20 mg by mouth every morning.       Marland Kitchen LORazepam (ATIVAN) 0.5 MG tablet Take 1 tablet (0.5 mg total) by mouth every 6 (six) hours as needed (Nausea or vomiting).  30 tablet  1  . morphine (MS CONTIN) 100 MG 12 hr tablet 100 mg 2 (two) times daily.       . Multiple Vitamin (MULTIVITAMIN WITH MINERALS) TABS Take 1 tablet by mouth daily.      . ondansetron (ZOFRAN) 8 MG tablet Take 1 tablet (8 mg total) by mouth every 12 (twelve) hours as needed. Take two times a day as needed for nausea or vomiting starting on the third day after chemotherapy.  30 tablet  1  . oxycodone (ROXICODONE) 30 MG immediate release tablet Take 30 mg by mouth every 6 (six) hours as needed for pain. Take 1-3 tabs q 6 hrs prn pain      . prochlorperazine (COMPAZINE) 10 MG tablet Take 1 tablet (10 mg total) by mouth every 6 (six) hours as needed (Nausea or vomiting).  30 tablet  1  . simvastatin (ZOCOR) 10 MG tablet Take 10 mg by mouth at bedtime.       No current facility-administered medications for this encounter.     ALLERGIES: Review of patient's allergies indicates no known allergies.   LABORATORY DATA:  Lab Results  Component Value Date   WBC 6.1 02/20/2013   HGB 9.7 Repeated and Verified* 02/20/2013   HCT 29.5* 02/20/2013   MCV 91.3 02/20/2013   PLT 219 02/20/2013   Lab Results  Component Value Date   NA 137 02/20/2013   K 4.9 02/20/2013   CL 100 02/18/2013   CO2 27 02/20/2013   Lab Results  Component Value Date   ALT 40 01/10/2013   AST 36* 01/10/2013   ALKPHOS 62 01/10/2013   BILITOT 0.33 01/10/2013     NARRATIVE: Brent [name] was seen today for weekly treatment management. The chart was checked and the patient's films were reviewed. The patient is doing satisfactorily with treatment. He denies any pain or loss of appetite. He is able to eat what he likes.  PHYSICAL EXAMINATION: weight is 146 lb 9.6 oz (66.497 kg). His temperature is 98.7 F (37.1 C). His blood pressure is 97/60 and his pulse is 63. His respiration is 20.        ASSESSMENT: The patient is doing satisfactorily with treatment.  PLAN: We will continue with the patient's radiation treatment as planned.

## 2013-03-11 NOTE — Progress Notes (Signed)
Pt left stating he had waited 30 min. He stated "Dr Mitzi Hansen can call me." Paged Dr Mitzi Hansen and informed him of above. Apologized to pt.

## 2013-03-12 ENCOUNTER — Ambulatory Visit
Admission: RE | Admit: 2013-03-12 | Discharge: 2013-03-12 | Disposition: A | Discharge status: Home / Self Care | Payer: Medicare Other | Source: Ambulatory Visit | Attending: Radiation Oncology | Admitting: Radiation Oncology

## 2013-03-12 VITALS — BP 105/66 | HR 61 | Temp 98.1°F | Ht 68.0 in | Wt 145.4 lb

## 2013-03-12 DIAGNOSIS — C029 Malignant neoplasm of tongue, unspecified: Secondary | ICD-10-CM

## 2013-03-12 NOTE — Progress Notes (Signed)
Mr. Daman has received  8 fractions to his tongue and bilateral neck.  He denies any pain or difficulty with swallowing.  He is C/O of fatigue.  His oral mucosa is moist and note small whitish area back of throat on the left. Note erythema on his neck, but he denies any tenderness of his skin in the [place]. Field.

## 2013-03-12 NOTE — Progress Notes (Deleted)
Spoke w/pt who states he had some concern about his low BP. He is monitoring it at home and states he did not take his BP med this morning. Advised pt to continue monitoring and keep record, then if BP remains low he should contact prescribing physician to inform. Also suggested pt come to nursing each day for BP check. Pt and wife verbalized agreement to keeping record of daily BP at home and holding BP med if BP less than 100/60. Will inform Dr Moody and Karen RN. 

## 2013-03-12 NOTE — Progress Notes (Signed)
Spoke w/pt who states he had some concern about his low BP. He is monitoring it at home and states he did not take his BP med this morning. Advised pt to continue monitoring and keep record, then if BP remains low he should contact prescribing physician to inform. Also suggested pt come to nursing each day for BP check. Pt and wife verbalized agreement to keeping record of daily BP at home and holding BP med if BP less than 100/60. Will inform Dr Mitzi Hansen and Evangeline Gula.

## 2013-03-13 ENCOUNTER — Ambulatory Visit
Admission: RE | Admit: 2013-03-13 | Discharge: 2013-03-13 | Disposition: A | Discharge status: Home / Self Care | Payer: Medicare Other | Source: Ambulatory Visit | Attending: Radiation Oncology | Admitting: Radiation Oncology

## 2013-03-13 ENCOUNTER — Other Ambulatory Visit: Payer: Self-pay

## 2013-03-13 DIAGNOSIS — C029 Malignant neoplasm of tongue, unspecified: Secondary | ICD-10-CM

## 2013-03-14 ENCOUNTER — Ambulatory Visit
Admission: RE | Admit: 2013-03-14 | Discharge: 2013-03-14 | Disposition: A | Discharge status: Home / Self Care | Payer: Medicare Other | Source: Ambulatory Visit | Attending: Radiation Oncology | Admitting: Radiation Oncology

## 2013-03-14 ENCOUNTER — Encounter: Payer: Self-pay | Admitting: Nutrition

## 2013-03-14 ENCOUNTER — Ambulatory Visit (HOSPITAL_BASED_OUTPATIENT_CLINIC_OR_DEPARTMENT_OTHER): Payer: Medicare Other | Admitting: Hematology and Oncology

## 2013-03-14 ENCOUNTER — Ambulatory Visit: Payer: Self-pay

## 2013-03-14 ENCOUNTER — Other Ambulatory Visit (HOSPITAL_BASED_OUTPATIENT_CLINIC_OR_DEPARTMENT_OTHER): Payer: Medicare Other | Admitting: Lab

## 2013-03-14 ENCOUNTER — Ambulatory Visit: Payer: Medicare Other

## 2013-03-14 VITALS — BP 94/62 | HR 72 | Temp 98.4°F | Resp 18 | Ht 68.0 in | Wt 141.2 lb

## 2013-03-14 DIAGNOSIS — C029 Malignant neoplasm of tongue, unspecified: Secondary | ICD-10-CM

## 2013-03-14 DIAGNOSIS — R300 Dysuria: Secondary | ICD-10-CM

## 2013-03-14 LAB — URINALYSIS, MICROSCOPIC - CHCC
Bilirubin (Urine): NEGATIVE
Nitrite: NEGATIVE
Protein: 300 mg/dL
Urobilinogen, UR: 0.2 mg/dL (ref 0.2–1)
pH: 6.5 (ref 4.6–8.0)

## 2013-03-14 LAB — CBC WITH DIFFERENTIAL/PLATELET
BASO%: 0.8 % (ref 0.0–2.0)
EOS%: 5.6 % (ref 0.0–7.0)
HCT: 26.9 % — ABNORMAL LOW (ref 38.4–49.9)
LYMPH%: 14.7 % (ref 14.0–49.0)
MCH: 30.6 pg (ref 27.2–33.4)
MCHC: 34.4 g/dL (ref 32.0–36.0)
MCV: 88.9 fL (ref 79.3–98.0)
MONO%: 14.1 % — ABNORMAL HIGH (ref 0.0–14.0)
NEUT%: 64.8 % (ref 39.0–75.0)
lymph#: 0.4 10*3/uL — ABNORMAL LOW (ref 0.9–3.3)

## 2013-03-14 LAB — COMPREHENSIVE METABOLIC PANEL (CC13)
ALT: 13 U/L (ref 0–55)
AST: 17 U/L (ref 5–34)
Calcium: 9.3 mg/dL (ref 8.4–10.4)
Chloride: 101 mEq/L (ref 98–109)
Creatinine: 1 mg/dL (ref 0.7–1.3)
Potassium: 4.9 mEq/L (ref 3.5–5.1)

## 2013-03-14 MED ORDER — CIPROFLOXACIN HCL 250 MG PO TABS: 250.0000 mg | ORAL_TABLET | Freq: Two times a day (BID) | ORAL | Status: DC

## 2013-03-14 NOTE — Progress Notes (Signed)
Rx for Cipro called in to [facility] [address]; patient is aware.

## 2013-03-17 ENCOUNTER — Encounter: Payer: Self-pay | Admitting: *Deleted

## 2013-03-17 ENCOUNTER — Ambulatory Visit
Admission: RE | Admit: 2013-03-17 | Discharge: 2013-03-17 | Disposition: A | Discharge status: Home / Self Care | Payer: Medicare Other | Source: Ambulatory Visit | Attending: Radiation Oncology | Admitting: Radiation Oncology

## 2013-03-17 ENCOUNTER — Ambulatory Visit: Payer: Self-pay

## 2013-03-17 ENCOUNTER — Other Ambulatory Visit: Payer: Self-pay | Admitting: Lab

## 2013-03-17 ENCOUNTER — Ambulatory Visit (HOSPITAL_BASED_OUTPATIENT_CLINIC_OR_DEPARTMENT_OTHER): Payer: Medicare Other

## 2013-03-17 VITALS — BP 105/69 | HR 76 | Temp 98.5°F | Resp 18

## 2013-03-17 DIAGNOSIS — Z5111 Encounter for antineoplastic chemotherapy: Secondary | ICD-10-CM

## 2013-03-17 DIAGNOSIS — C029 Malignant neoplasm of tongue, unspecified: Secondary | ICD-10-CM

## 2013-03-17 MED ORDER — SODIUM CHLORIDE 0.9 % IV SOLN
150.0000 mg | Freq: Once | INTRAVENOUS | Status: AC
  Administered 2013-03-17: 150 mg via INTRAVENOUS
  Filled 2013-03-17: qty 5

## 2013-03-17 MED ORDER — SODIUM CHLORIDE 0.9 % IV SOLN
Freq: Once | INTRAVENOUS | Status: AC
  Administered 2013-03-17: 09:00:00 via INTRAVENOUS

## 2013-03-17 MED ORDER — DEXAMETHASONE SODIUM PHOSPHATE 20 MG/5ML IJ SOLN
12.0000 mg | Freq: Once | INTRAMUSCULAR | Status: AC
  Administered 2013-03-17: 12 mg via INTRAVENOUS

## 2013-03-17 MED ORDER — SODIUM CHLORIDE 0.9 % IV SOLN
Freq: Once | INTRAVENOUS | Status: AC
  Administered 2013-03-17: 11:00:00 via INTRAVENOUS

## 2013-03-17 MED ORDER — PALONOSETRON HCL INJECTION 0.25 MG/5ML
0.2500 mg | Freq: Once | INTRAVENOUS | Status: AC
  Administered 2013-03-17: 0.25 mg via INTRAVENOUS

## 2013-03-17 MED ORDER — POTASSIUM CHLORIDE 2 MEQ/ML IV SOLN
Freq: Once | INTRAVENOUS | Status: AC
  Administered 2013-03-17: 09:00:00 via INTRAVENOUS
  Filled 2013-03-17: qty 10

## 2013-03-17 MED ORDER — SODIUM CHLORIDE 0.9 % IV SOLN
100.0000 mg/m2 | Freq: Once | INTRAVENOUS | Status: AC
  Administered 2013-03-17: 178 mg via INTRAVENOUS
  Filled 2013-03-17: qty 178

## 2013-03-17 NOTE — Progress Notes (Signed)
Checked in with patient while he was receiving chemo treatment.  He indicated his throat was sore and that he was getting mouth sores.  I asked if he had been using mouth rinses as suggested to which he replied that he had not been provided instructions for salt/baking soda rinse or hydrogen-peroxide rinse.  I facilitated a visit with Dr. Mitzi Hansen after his IMRT and provided him a copy of information originally given to him by Verdie Mosher re: mouth rinses and re-educated him on their use.  Pt indicated understanding.  Young Berry, RN, BSN, [facility] Navigator [phone]

## 2013-03-17 NOTE — Progress Notes (Signed)
Pt voided only 100 cc pre Cisplatin today. Dr. Roanna Raider notified. Order received to give extra Normal Saline 1L pre chemo.  Explanations given to pt. Pt voided 550cc pre CDDP;  Voided  900cc post CDDP. Spoke with desk nurse Cameo, RN about pt's next f/u and chemo appts.  Instructed pt and daughter to see scheduler either Wed or Thurs for new appt calendar.  Pt voiced understanding.

## 2013-03-17 NOTE — Patient Instructions (Addendum)
[facility] Discharge Instructions for Patients Receiving Chemotherapy  Today you received the following chemotherapy agents : Cisplatin  To help prevent nausea and vomiting after your treatment, we encourage you to take your nausea medication as instructed by your physician.  Take Zofran 8 mg by mouth every 12 hrs as needed for nausea  Starting third day after chemo.  Can also take Compazine 10 mg by mouth every 6 hrs as needed for nausea.  DO NOT Drive after taking Compazine as it can cause drowsiness.   If you develop nausea and vomiting that is not controlled by your nausea medication, call the clinic.   BELOW ARE SYMPTOMS THAT SHOULD BE REPORTED IMMEDIATELY:  *FEVER GREATER THAN 100.5 F  *CHILLS WITH OR WITHOUT FEVER  NAUSEA AND VOMITING THAT IS NOT CONTROLLED WITH YOUR NAUSEA MEDICATION  *UNUSUAL SHORTNESS OF BREATH  *UNUSUAL BRUISING OR BLEEDING  TENDERNESS IN MOUTH AND THROAT WITH OR WITHOUT PRESENCE OF ULCERS  *URINARY PROBLEMS  *BOWEL PROBLEMS  UNUSUAL RASH Items with * indicate a potential emergency and should be followed up as soon as possible.  Feel free to call the clinic you have any questions or concerns. The clinic phone number is [phone].

## 2013-03-18 ENCOUNTER — Ambulatory Visit
Admission: RE | Admit: 2013-03-18 | Discharge: 2013-03-18 | Disposition: A | Discharge status: Home / Self Care | Payer: Medicare Other | Source: Ambulatory Visit | Attending: Radiation Oncology | Admitting: Radiation Oncology

## 2013-03-19 ENCOUNTER — Ambulatory Visit
Admission: RE | Admit: 2013-03-19 | Discharge: 2013-03-19 | Disposition: A | Discharge status: Home / Self Care | Payer: Medicare Other | Source: Ambulatory Visit | Attending: Radiation Oncology | Admitting: Radiation Oncology

## 2013-03-20 ENCOUNTER — Ambulatory Visit
Admission: RE | Admit: 2013-03-20 | Discharge: 2013-03-20 | Disposition: A | Discharge status: Home / Self Care | Payer: Medicare Other | Source: Ambulatory Visit | Attending: Radiation Oncology | Admitting: Radiation Oncology

## 2013-03-21 ENCOUNTER — Ambulatory Visit
Admission: RE | Admit: 2013-03-21 | Discharge: 2013-03-21 | Disposition: A | Discharge status: Home / Self Care | Payer: Medicare Other | Source: Ambulatory Visit | Attending: Radiation Oncology | Admitting: Radiation Oncology

## 2013-03-21 ENCOUNTER — Encounter: Payer: Self-pay | Admitting: Radiation Oncology

## 2013-03-21 ENCOUNTER — Encounter: Payer: Self-pay | Admitting: Nutrition

## 2013-03-21 VITALS — BP 119/77 | HR 73 | Temp 98.6°F | Ht 68.0 in | Wt 145.6 lb

## 2013-03-21 DIAGNOSIS — C029 Malignant neoplasm of tongue, unspecified: Secondary | ICD-10-CM

## 2013-03-21 MED ORDER — MAGIC MOUTHWASH W/LIDOCAINE: 15.0000 mL | Freq: Four times a day (QID) | ORAL | Status: DC | PRN

## 2013-03-21 NOTE — Progress Notes (Signed)
Patient was seen by RD, and assessed on August 1.  Patient was predicted to have suboptimal energy intake.  Patient had a followup appointment scheduled however, patient and wife cancelled this appointment stating"Patient does not need to use his feeding tube".  I reviewed patient's weight today and noted that his weight was documented as 141.2 pounds August 22, which indicates a 7% weight loss in 3 weeks.  I will attempt again to schedule patient for nutrition follow up for diet education and tube feeding initiation if patient agreeable.

## 2013-03-21 NOTE — Progress Notes (Signed)
  Radiation Oncology         [phone]) [phone] ________________________________  Name: Brent [name] MRN: [id number]  Date: 03/21/2013  DOB: [birth year]  Weekly Radiation Therapy Management  Current Dose: 28 Gy     Planned Dose:  60 Gy  Narrative . . . . . . . . The patient presents for routine under treatment assessment.                                                    He has had 14 fractions to his tongue and bilateral neck. He has generalized arthritis pain that he is rating at a 6/10. He is having some sharp pains in his throat when swallowing. He is able to swallow soft foods and liquids. He said that his saliva is occasional thicker. He does have a large ulcer on the inside of his bottom lip and a small ulcer on the left side of his tongue. He said they are painful. He denies hearing changes                                 Set-up films were reviewed.                                 The chart was checked. Physical Findings. Marland Kitchen .His voice is raspy. The skin on his neck is red   height is 5\' 8"  (1.727 m) and weight is 145 lb 9.6 oz (66.044 kg). His temperature is 98.6 F (37 C). His blood pressure is 119/77 and his pulse is 73. His oxygen saturation is 98%.  no thrush, early mucositis. Weight essentially stable.  No significant changes. Impression . . . . . . . The patient is  tolerating radiation. Plan . . . . . . . . . . . . Continue treatment as planned.  ________________________________  Artist Pais. Kathrynn Running, M.D.

## 2013-03-21 NOTE — Progress Notes (Addendum)
Brent [name] here for weekly under treat visit.  He has had 14 fractions to his tongue and bilateral neck.  He has generalized arthritis pain that he is rating at a 6/10.  He is having some sharp pains in his throat when swallowing.  He is able to swallow soft foods and liquids.  He does have a peg tube but is not using it.  He said that his saliva is occasional thick.  He does have a large ulcer on the inside of his bottom lip and a small ulcer on the left side of his tongue.  He said they are painful.  He denies hearing changes.  His voice is raspy.  The skin on his neck is red.  He is using radiaplex gel twice a day.  He is wondering if there will be a scan done to see if there has been any progress in reducing his cancer.  Patient's wife stated that she was concerned about not knowing when [name] will see the Doctor each week.  She stated that it is very inconsiderate to the patient's time.  Apologized for the inconvenience and notified Sonda Rumble, RN, Facilities manager.

## 2013-03-24 NOTE — Progress Notes (Signed)
ID: Brent [name] OB: Aug 22, 1950  Brent#: [id number]  NWG#:[id number]  [facility]  [phone] [phone]   OFFICE PROGRESS NOTE  PCP: Floyde Parkins, MD  DIAGNOSIS: left lingual squamous cell carcinoma; pT1 N2a cM0 (stage IVA)   PAST THERAPY: left hemiglossectomy on 01/23/2013 with left neck dissection. Pathology was notable for negative final margin; 2/23 nodes positive without extracapsular extension; but positive for lymphovascular invasion.   CURRENT THERAPY: Concurrent adjuvant chemoradiation with cisplatin 100 mg/m2 Q3W.  INTERVAL HISTORY:  Brent [name] 62 y.o. male returns for regular follow up visit. His appetite is not good. His nausea is under control with medication. When patient constipated he takes MiraLax. He reported dysuria. Brent [name] continue to have the same joint pain. The patient denied fever, chills, night sweats. He denied headaches, double vision, blurry vision, nasal congestion, nasal discharge, hearing problems, odynophagia or dysphagia. No chest pain, palpitations, dyspnea, cough, abdominal pain, vomiting, diarrhea,  hematochezia. The patient denied nocturia, polyuria, hematuria, myalgia, numbness, tingling, psychiatric problems.  Review of Systems  Constitutional: Negative for fever, chills, weight loss, malaise/fatigue and diaphoresis.  HENT: Positive for neck pain. Negative for hearing loss, ear pain, nosebleeds, congestion, sore throat and tinnitus.   Eyes: Negative for blurred vision, double vision, photophobia and pain.  Respiratory: Negative for cough, hemoptysis, sputum production, shortness of breath, wheezing and stridor.   Cardiovascular: Negative for chest pain, palpitations, orthopnea, claudication, leg swelling and PND.  Gastrointestinal: Positive for nausea and constipation. Negative for heartburn, vomiting, abdominal pain, diarrhea, blood in stool and melena.  Genitourinary: Positive for dysuria. Negative for  urgency, frequency, hematuria and flank pain.  Musculoskeletal: Positive for back pain and joint pain. Negative for myalgias and falls.  Skin: Negative for itching and rash.  Neurological: Negative for dizziness, tingling, tremors, sensory change, speech change, focal weakness, seizures, loss of consciousness, weakness and headaches.  Endo/Heme/Allergies: Does not bruise/bleed easily.  Psychiatric/Behavioral: Negative.    PAST MEDICAL HISTORY: Past Medical History  Diagnosis Date  . Arthritis   . DVT (deep venous thrombosis) 2006    associated with surgery  . PE (pulmonary embolism) 2006    associated with surgery  . Diverticulosis   . Hypertension     [facility]  [place]  [facility] DYER  . Tongue cancer     PAST SURGICAL HISTORY: Past Surgical History  Procedure Laterality Date  . Knee surgery Left   . Above knee leg amputation Left 2006    Dr. Renae Fickle  . Colonoscopy  2011  . Direct laryngoscopy N/A 01/23/2013    Procedure: DIRECT LARYNGOSCOPY; RIGID BRONCHOSCOPY;  Surgeon: [name], MD;  Location: [facility] OR;  Service: ENT;  Laterality: N/A;  . Glossectomy Left 01/23/2013    Procedure: HEMIGLOSSECTOMY;  Surgeon: [name], MD;  Location: [facility] OR;  Service: ENT;  Laterality: Left;  . Radical neck dissection Left 01/23/2013    Procedure: LEFT SELECTIVE NECK DISSECTION;  Surgeon: [name], MD;  Location: [facility] OR;  Service: ENT;  Laterality: Left;  . Esophagoscopy N/A 01/23/2013    Procedure: ESOPHAGOSCOPY;  Surgeon: [name], MD;  Location: [facility] OR;  Service: ENT;  Laterality: N/A;    FAMILY HISTORY Family History  Problem Relation Age of Onset  . Prostate cancer    . Asthma Son   . Arthritis/Rheumatoid Mother   . Colon cancer Father 49    HEALTH MAINTENANCE: History  Substance Use Topics  . Smoking status: Former Smoker -- 1.00 packs/day for 30 years  Quit date: 12/22/2012  . Smokeless tobacco: Never Used  . Alcohol Use: Yes     Comment: one beer or bourbon drink per week   No  Known Allergies  Current Outpatient Prescriptions  Medication Sig Dispense Refill  . ALPRAZolam (XANAX) 0.5 MG tablet Take 0.5 mg by mouth daily as needed for anxiety.      . cyclobenzaprine (FLEXERIL) 10 MG tablet Take 10 mg by mouth daily as needed for muscle spasms.       Marland Kitchen docusate sodium (COLACE) 100 MG capsule Take 100 mg by mouth daily as needed for constipation.      Marland Kitchen ibuprofen (ADVIL,MOTRIN) 400 MG tablet Take 1 tablet (400 mg total) by mouth every 6 (six) hours as needed.  30 tablet  0  . LORazepam (ATIVAN) 0.5 MG tablet Take 1 tablet (0.5 mg total) by mouth every 6 (six) hours as needed (Nausea or vomiting).  30 tablet  1  . morphine (MS CONTIN) 100 MG 12 hr tablet 100 mg 2 (two) times daily.       . Multiple Vitamin (MULTIVITAMIN WITH MINERALS) TABS Take 1 tablet by mouth daily.      . ondansetron (ZOFRAN) 8 MG tablet Take 1 tablet (8 mg total) by mouth every 12 (twelve) hours as needed. Take two times a day as needed for nausea or vomiting starting on the third day after chemotherapy.  30 tablet  1  . oxycodone (ROXICODONE) 30 MG immediate release tablet Take 30 mg by mouth every 6 (six) hours as needed for pain. Take 1-3 tabs q 6 hrs prn pain      . prochlorperazine (COMPAZINE) 10 MG tablet Take 1 tablet (10 mg total) by mouth every 6 (six) hours as needed (Nausea or vomiting).  30 tablet  1  . simvastatin (ZOCOR) 10 MG tablet Take 10 mg by mouth at bedtime.      . Alum & Mag Hydroxide-Simeth (MAGIC MOUTHWASH W/LIDOCAINE) SOLN Take 15 mLs by mouth 4 (four) times daily as needed (discomfort).  500 mL  5  . ciprofloxacin (CIPRO) 250 MG tablet Take 1 tablet (250 mg total) by mouth 2 (two) times daily.  10 tablet  0  . lisinopril (PRINIVIL,ZESTRIL) 20 MG tablet Take 20 mg by mouth every morning.        No current facility-administered medications for this visit.    OBJECTIVE: Filed Vitals:   03/14/13 0836  BP: 94/62  Pulse: 72  Temp: 98.4 F (36.9 C)  Resp: 18     Body mass  index is 21.47 kg/(m^2).    ECOG FS: 1  PHYSICAL EXAMINATION:  HEENT: Sclerae anicteric.  Conjunctivae were pink. Pupils round and reactive bilaterally. Oral mucosa is moist without ulceration or thrush. No occipital, submandibular, cervical, supraclavicular or axillar adenopathy. Numbness at site of surgery. Lungs: clear to auscultation without wheezes. No rales or rhonchi. Heart: regular rate and rhythm. No murmur, gallop or rubs. Abdomen: soft, non tender. No guarding or rebound tenderness. Bowel sounds are present. No palpable hepatosplenomegaly. MSK: no focal spinal tenderness. Extremities: No clubbing or cyanosis.No calf tenderness to palpitation, no peripheral edema. The patient had grossly intact strength in upper and lower extremities. Skin exam was without ecchymosis, petechiae. Neuro: non-focal, alert and oriented to time, person and place, appropriate affect  LAB RESULTS:  CMP     Component Value Date/Time   NA 136 03/14/2013 0757   NA 136 02/18/2013 1200   K 4.9 03/14/2013 0757   K 4.6  02/18/2013 1200   CL 100 02/18/2013 1200   CL 106 01/10/2013 1421   CO2 27 03/14/2013 0757   CO2 27 02/18/2013 1200   GLUCOSE 113 03/14/2013 0757   GLUCOSE 97 02/18/2013 1200   GLUCOSE 113* 01/10/2013 1421   BUN 13.1 03/14/2013 0757   BUN 52* 02/18/2013 1200   CREATININE 1.0 03/14/2013 0757   CREATININE 1.00 02/18/2013 1200   CALCIUM 9.3 03/14/2013 0757   CALCIUM 9.4 02/18/2013 1200   PROT 6.4 03/14/2013 0757   ALBUMIN 3.2* 03/14/2013 0757   AST 17 03/14/2013 0757   ALT 13 03/14/2013 0757   ALKPHOS 69 03/14/2013 0757   BILITOT 0.25 03/14/2013 0757   GFRNONAA 78* 02/18/2013 1200   GFRAA >90 02/18/2013 1200   Lab Results  Component Value Date   WBC 2.9* 03/14/2013   NEUTROABS 1.9 03/14/2013   HGB 9.3* 03/14/2013   HCT 26.9* 03/14/2013   MCV 88.9 03/14/2013   PLT 178 03/14/2013      Chemistry      Component Value Date/Time   NA 136 03/14/2013 0757   NA 136 02/18/2013 1200   K 4.9 03/14/2013 0757   K  4.6 02/18/2013 1200   CL 100 02/18/2013 1200   CL 106 01/10/2013 1421   CO2 27 03/14/2013 0757   CO2 27 02/18/2013 1200   BUN 13.1 03/14/2013 0757   BUN 52* 02/18/2013 1200   CREATININE 1.0 03/14/2013 0757   CREATININE 1.00 02/18/2013 1200      Component Value Date/Time   CALCIUM 9.3 03/14/2013 0757   CALCIUM 9.4 02/18/2013 1200   ALKPHOS 69 03/14/2013 0757   AST 17 03/14/2013 0757   ALT 13 03/14/2013 0757   BILITOT 0.25 03/14/2013 0757      Urinalysis    Component Value Date/Time   LABSPEC 1.015 03/14/2013 1118   GLUCOSEU Negative 03/14/2013 1118   UROBILINOGEN 0.2 03/14/2013 1118    STUDIES: Ct Abdomen W Contrast  02/27/2013   *RADIOLOGY REPORT*  Clinical Data: Leaking and pain at gastrostomy tube insertion site  CT ABDOMEN WITH CONTRAST  Technique:  Multidetector CT imaging of the abdomen was performed following the standard protocol during bolus administration of intravenous contrast.  Contrast: OMNIPAQUE IOHEXOL 300 MG/ML  SOLN  Comparison: Fluoroscopic-guided gastrostomy tube placement - 02/18/2013; fluoroscopic-guided gastrostomy tube check - earlier same day; PET CT - 01/06/2013; CT abdomen and pelvis - 03/26/2005  Findings:  The recently placed gastrostomy tube is appropriately positioned with gastrostomy disc located within the gastric lumen.  There is a minimal amount of soft tissue stranding about the course of the gastrostomy tube without discrete/definable fluid collection.  The recently administered enteric contrast and fluoroscopic guided gastrostomy tube check is seen appropriate position within the stomach.  There is no evidence of enteric contrast extravasation or extraluminal contrast.  There is a large amount of residual barium within the moderately dilated transverse colon.  No pneumoperitoneum, pneumatosis or portal venous gas.  Scattered atherosclerotic plaque within a tortuous but normal caliber abdominal aorta.  The major branch vessels of the abdominal aorta are patent on  this non CTA examination.  Scattered shoddy retroperitoneal lymph nodes are grossly unchanged and not enlarged by CT criteria with index left sided periaortic lymph node measuring 0.9 cm in short axis diameter (image 32, series 2).  Normal hepatic contour.  There is a sub centimeter hypoattenuating lesion with the dome of the right lobe liver (image 12, series 12 which is too small to accurately characterize  though is less conspicuous on the provided portal venous phase images and may represent a flash filling hepatic hemangioma.  Normal appearance of the gallbladder.  No radiopaque gallstones.  There is mild dilatation of the common bile duct measuring approximate 1.2 cm in greatest oblique coronal dimension (image 32, series 602) the etiology of which is not detected on this examination.  No definite pancreatic ductal dilatation.  No ascites.  There is symmetric enhancement and excretion of the bilateral kidneys.  No renal stones.  No discrete renal lesions or evidence of urinary obstruction.  Normal appearance of the bilateral adrenal glands and spleen.  Limited visualization of the lower thorax demonstrates minimal subsegmental atelectasis within the left lower lobe.  No focal airspace opacity.  No pleural effusion.  Normal heart size.  No pericardial effusion.  No acute or aggressive osseous abnormalities.  IMPRESSION: 1.  Appropriately positioned and functioning gastrostomy tube.  No evidence of abscess formation or extraluminal contrast. 2.  Large amount of residual barium within the transverse colon, findings suggestive of ileus.  3. Mild dilatation of the common bile duct, measuring approximately 1.2 cm in greatest oblique coronal dimension, the etiology of which is not depicted on this examination though appears somewhat similar to remote noncontrast abdominal CT performed in 2006.  Further evaluation with non emergent MRCP may be performed as clinically indicated.   Original Report Authenticated By: Tacey Ruiz, MD   Ir Cm Inj Any Colonic Tube W/fluoro  03/04/2013   *RADIOLOGY REPORT*  Clinical history:History of tongue cancer and currently getting radiation.  The patient had a percutaneous gastrostomy tube placed on 02/18/2013.  The gastrostomy tube has been leaking since placement.  The patient complains that the tube leaks after he eats. He is not currently using the gastrostomy tube for feeding.  PROCEDURE(S): INJECTION OF GASTROSTOMY TUBE  Physician: Rachelle Hora. Henn, MD  Medications:None  Moderate sedation time:None  Fluoroscopy time: 1 minute and 42 seconds  Procedure:The skin around the gastrostomy tube is very red and irritated.  Small amount of leakage was identified with mild manipulation.  The patient was brought to the fluoroscopic suite. The tube was injected with Omnipaque under fluoroscopy.  The patient was placed in a right lateral decubitus position and additional contrast and water was injected.  Catheter was flushed with water at the end of the procedure.  Findings:Gastrostomy tube is well positioned in the stomach.  There is no evidence for contrast leaking on the skin when the patient was in the right lateral decubitus position.  In addition, contrast was emptying into the small bowel.  There is retained contrast in the colon from the exam 5 days ago.  Complications: None  Impression:Gastrostomy tube is well positioned.  The patient complains of leakage around the gastrostomy tube after eating and drinking.  I suspect that the patient has a gastroparesis and/or very slow bowel function based on the residual contrast from 5 days ago.  No evidence for leakage when the patient is in the right lateral decubitus position.  I am concerned about the skin irritation around the gastrostomy tube and feel that we need to divert feedings beyond the stomach until the skin can heal. Plan to convert the gastrostomy tube to a G J-tube on August 14.   Original Report Authenticated By: Richarda Overlie, M.D.   Ir Cm  Inj Any Colonic Tube W/fluoro  02/27/2013   *RADIOLOGY REPORT*  Clinical Data: Persistent pain and leaking at gastrostomy tube insertion site  GI  TUBE INJECTION  Comparison: Fluoroscopic-guided gastrostomy tube placement - 02/18/2013; PET CT - 01/06/2013  Findings:  Preprocedural physical inspection of the gastrostomy tube demonstrates erythema surrounding the gastrostomy tube exit site. Gastric contents were noted spilling around the external portion of the existing gastrostomy catheter.  Preprocedural fluoroscopic image demonstrates grossly unchanged positioning of the recently placed gastrostomy tube with disc overlying expected location of the stomach.  There is a large amount of retained barium within the transverse colon.  Multiple spot fluoroscopic and radiographic images were obtained following the administration of a small amount of contrast via gastrostomy tube confirming appropriate intraluminal positioning. There is no definite leak of contrast around the gastrostomy tract.  IMPRESSION: 1.  Appropriately positioned and functioning gastrostomy tube.  No exchange performed.  2.  Given the marked erythema surrounding the gastrostomy tube exit site, the decision was made to obtain an abdominal CT to ensure there is no subcutaneous abscess formation.  PLAN:  Subsequent abdominal CT confirmed appropriate positioning of the gastrostomy tube and was negative for subcutaneous or deep abscess formation. As the gastrostomy tube is appropriately positioned and functioning, no exchange is necessary (especially in light of recent gastrostomy tube placement).  I feel the persistent leaking about the gastrostomy tube is likely secondary to previously undiagnosed gastroparesis and overall decreased bowel motility (fluoroscopic imaging demonstrates residual barium throughout the colon from prior gastrostomy tube placement performed greater than 1 week ago; additionally, the patient reports having to undergo an enema at  least once a week secondary to chronic narcotic use).  The patient was given the option of overnight observation for gastrostomy suction, however he refused.  As such, the patient was given large catheter tip syringe and instructed on how to perform gastric decompression at home as indicated.  Additional intervention is not indicated at the present time.   Original Report Authenticated By: Tacey Ruiz, MD   Ir Radiologist Eval & Mgmt  02/24/2013   *RADIOLOGY REPORT* - [phone]  Pt comes in about 5 days post G-tube placement. Having pain, swelling, redness at site. No fevers, no N/V.  G-tube site with obvious erythema and swelling and tenderness. Bumper is very tight at about 2.5cm mark. Bumper relaxed and immediate return of purulent drainage. Pt did feel relief after more drainage expressed. Sterile gauze dressing applied and bumper secured at about 3.5cm mark with gauze under.  Advised to expect drainage and keep clean dressing in place. Rx for Septra DS given BID X 7 days.  Pt will return on Wed 8/6 for re-eval.  Brayton El PA-C Interventional Radiology 02/24/2013 12:54 PM   Original Report Authenticated By: Jolaine Click, M.D.    ASSESSMENT AND PLAN: 1. Diagnosis:  Squamous cell carcinoma of tongue:  - Primary location: Left tongue  - Stage: Stage IVA (due to presence of two lymph nodes).  - Continue  radiation to the tongue and the neck with chemotherapy - Proceed with cycle 2 of cisplatin 100 mg/m2 on 03/17/13 concurrent adjuvant chemoradiation.  2. UTI. Cipro 250 mg p.o. B.i.d for 5 days. 3. Nausea. Compazine/Zofran/Ativan to be taken prn.  4. Constipation. Patient recommended to take MOM p.r.n. 5.  Follow up: In 3 weeks      Myra Rude, MD   03/14/2013 3:51 PM

## 2013-03-25 ENCOUNTER — Ambulatory Visit: Payer: Medicare Other

## 2013-03-25 ENCOUNTER — Telehealth: Payer: Self-pay | Admitting: Oncology

## 2013-03-25 ENCOUNTER — Telehealth: Payer: Self-pay | Admitting: *Deleted

## 2013-03-25 NOTE — Telephone Encounter (Signed)
Spoke with pt's wife.  She indicated she was able to get pt to take some Magic Mouthwash which brought some relief to his mouth pain.  She stated he has been able to eat and drink small amounts.  I suggested that icechips might provide some relief.  I indicated I would see pt tomorrow when he sees Dr. Mitzi Hansen.  Pt wife expressed appreciation for my call.  Young Berry, RN, BSN, [facility] Head & Neck Oncology Navigator [phone]

## 2013-03-25 NOTE — Telephone Encounter (Signed)
Received a call from Brent [name].  He is concerned about his side effects, especially his mouth.  He said it feels like hamburger.  Asked him if he is using his magic mouthwash and he said that he can't because it tastes too bad.  He did cancel treatment today.  He stated that he wants to know if there are any benefits to continuing treatment.  He reports that he feels like he is on "the cancer train" and there will be no benefits and he will feel like "blowing his brains out."   Asked him to come in to see Dr. Mitzi Hansen tomorrow before his treatment time.  He is willing to come in.  Also notified Lauren, Child psychotherapist and [employer] geologist.

## 2013-03-26 ENCOUNTER — Encounter: Payer: Self-pay | Admitting: *Deleted

## 2013-03-26 ENCOUNTER — Ambulatory Visit
Admission: RE | Admit: 2013-03-26 | Discharge: 2013-03-26 | Disposition: A | Discharge status: Home / Self Care | Payer: Medicare Other | Source: Ambulatory Visit | Attending: Radiation Oncology | Admitting: Radiation Oncology

## 2013-03-26 ENCOUNTER — Other Ambulatory Visit: Payer: Self-pay | Admitting: *Deleted

## 2013-03-26 ENCOUNTER — Telehealth: Payer: Self-pay | Admitting: Hematology and Oncology

## 2013-03-26 ENCOUNTER — Ambulatory Visit (HOSPITAL_BASED_OUTPATIENT_CLINIC_OR_DEPARTMENT_OTHER): Payer: Medicare Other | Admitting: Lab

## 2013-03-26 DIAGNOSIS — C029 Malignant neoplasm of tongue, unspecified: Secondary | ICD-10-CM

## 2013-03-26 LAB — CBC WITH DIFFERENTIAL/PLATELET
Basophils Absolute: 0 10*3/uL (ref 0.0–0.1)
EOS%: 1.8 % (ref 0.0–7.0)
HGB: 8.5 g/dL — ABNORMAL LOW (ref 13.0–17.1)
MCH: 30.5 pg (ref 27.2–33.4)
MCHC: 34.6 g/dL (ref 32.0–36.0)
MCV: 88.1 fL (ref 79.3–98.0)
MONO%: 6.9 % (ref 0.0–14.0)
NEUT%: 79.2 % — ABNORMAL HIGH (ref 39.0–75.0)
RDW: 12.9 % (ref 11.0–14.6)

## 2013-03-26 LAB — BASIC METABOLIC PANEL (CC13)
BUN: 14.5 mg/dL (ref 7.0–26.0)
Creatinine: 1.2 mg/dL (ref 0.7–1.3)
Potassium: 4.3 mEq/L (ref 3.5–5.1)

## 2013-03-26 LAB — MAGNESIUM (CC13): Magnesium: 2 mg/dl (ref 1.5–2.5)

## 2013-03-26 NOTE — Telephone Encounter (Signed)
S/w pt re next appt for 9/3 - this afternoon @ 2:45pm prior to xrt. Pt to get new schedule when he comes in. Message to desk nurse that pt seems a little confused and someone may have to make sure pt is aware of what his should be doing. Message to [facility] re d/t for nut appt.

## 2013-03-26 NOTE — Progress Notes (Signed)
Met with pt and his wife during and after visit with Dr. Mitzi Hansen.  Pt expressed frustration with last minute scheduling of today's lab work and not understanding why is was needed.  Expressed further "anger" about not understanding what to expect during his radiation treatments.  I encouraged pt to call me anytime with questions and concerns; he recognized he hasn't been doing that.  He indicated understanding and expressed appreciation for my time.  I will continue to follow.  Young Berry, RN, BSN, [facility] Head & Neck Oncology Navigator [phone]

## 2013-03-26 NOTE — Progress Notes (Signed)
Brent [name] has an area on the back of his neck that he has been scratching.  Advised him to apply hydrocortizone cream and keep applying his biafine cream.  Will check on him tomorrow after treatment.

## 2013-03-27 ENCOUNTER — Other Ambulatory Visit: Payer: Self-pay | Admitting: Radiation Oncology

## 2013-03-27 ENCOUNTER — Encounter: Payer: Self-pay | Admitting: Nutrition

## 2013-03-27 ENCOUNTER — Encounter: Payer: Self-pay | Admitting: *Deleted

## 2013-03-27 ENCOUNTER — Ambulatory Visit
Admission: RE | Admit: 2013-03-27 | Discharge: 2013-03-27 | Disposition: A | Discharge status: Home / Self Care | Payer: Medicare Other | Source: Ambulatory Visit | Attending: Radiation Oncology | Admitting: Radiation Oncology

## 2013-03-27 MED ORDER — CITALOPRAM HYDROBROMIDE 20 MG PO TABS: 20.0000 mg | ORAL_TABLET | Freq: Every day | ORAL | Status: DC

## 2013-03-27 NOTE — Progress Notes (Signed)
Department of Radiation Oncology  Phone:  [phone] Fax:        [phone]  Weekly Treatment Note    Name: Brent [name] Date: 03/27/2013 MRN: [id number] DOB: 1951/01/17   Current dose: 30 Gy  Current fraction: 15   MEDICATIONS: Current Outpatient Prescriptions  Medication Sig Dispense Refill  . ALPRAZolam (XANAX) 0.5 MG tablet Take 0.5 mg by mouth daily as needed for anxiety.      . Alum & Mag Hydroxide-Simeth (MAGIC MOUTHWASH W/LIDOCAINE) SOLN Take 15 mLs by mouth 4 (four) times daily as needed (discomfort).  500 mL  5  . ciprofloxacin (CIPRO) 250 MG tablet Take 1 tablet (250 mg total) by mouth 2 (two) times daily.  10 tablet  0  . cyclobenzaprine (FLEXERIL) 10 MG tablet Take 10 mg by mouth daily as needed for muscle spasms.       Marland Kitchen docusate sodium (COLACE) 100 MG capsule Take 100 mg by mouth daily as needed for constipation.      Marland Kitchen ibuprofen (ADVIL,MOTRIN) 400 MG tablet Take 1 tablet (400 mg total) by mouth every 6 (six) hours as needed.  30 tablet  0  . lisinopril (PRINIVIL,ZESTRIL) 20 MG tablet Take 20 mg by mouth every morning.       Marland Kitchen LORazepam (ATIVAN) 0.5 MG tablet Take 1 tablet (0.5 mg total) by mouth every 6 (six) hours as needed (Nausea or vomiting).  30 tablet  1  . morphine (MS CONTIN) 100 MG 12 hr tablet 100 mg 2 (two) times daily.       . Multiple Vitamin (MULTIVITAMIN WITH MINERALS) TABS Take 1 tablet by mouth daily.      . ondansetron (ZOFRAN) 8 MG tablet Take 1 tablet (8 mg total) by mouth every 12 (twelve) hours as needed. Take two times a day as needed for nausea or vomiting starting on the third day after chemotherapy.  30 tablet  1  . oxycodone (ROXICODONE) 30 MG immediate release tablet Take 30 mg by mouth every 6 (six) hours as needed for pain. Take 1-3 tabs q 6 hrs prn pain      . prochlorperazine (COMPAZINE) 10 MG tablet Take 1 tablet (10 mg total) by mouth every 6 (six) hours as needed (Nausea or vomiting).  30 tablet  1  . simvastatin (ZOCOR) 10  MG tablet Take 10 mg by mouth at bedtime.       No current facility-administered medications for this encounter.     ALLERGIES: Review of patient's allergies indicates no known allergies.   LABORATORY DATA:  Lab Results  Component Value Date   WBC 2.9* 03/26/2013   HGB 8.5* 03/26/2013   HCT 24.6* 03/26/2013   MCV 88.1 03/26/2013   PLT 111* 03/26/2013   Lab Results  Component Value Date   NA 136 03/26/2013   K 4.3 03/26/2013   CL 100 02/18/2013   CO2 28 03/26/2013   Lab Results  Component Value Date   ALT 13 03/14/2013   AST 17 03/14/2013   ALKPHOS 69 03/14/2013   BILITOT 0.25 03/14/2013     NARRATIVE: Brent [name] was seen today for weekly treatment management. The chart was checked and the patient's films were reviewed. I had a detailed discussion with the patient today. He was frustrated because he felt like there was a lack of information. He is beginning to have some side effects and did not fully recall some of our initial conversation. This is common with patients gaining a better understanding  of the side effects of treatment once these began. He was frustrated with some logistical aspects of his care. We discussed this plan in broad terms as well as some of the specifics which she wanted to discuss. He felt much better I believe after this conversation. I discussed with him my recommendation to proceed with treatment as I believe that he is at some significant risk for local/regional failure. He expressed understanding of these issues.  PHYSICAL EXAMINATION: vitals were not taken for this visit.       ASSESSMENT: The patient is doing satisfactorily with treatment.  PLAN: We will continue with the patient's radiation treatment as planned. I will plan to see the patient again later this week. He request a change in his anxiety medicine and I believe that we can increase his dose of Xanax. He also is interested in an antidepressant and we will also work with him on beginning such a  medication as well.

## 2013-03-27 NOTE — Progress Notes (Signed)
Asked patient if he was considering "blowing my brains out" as he previously stated.  He replied "I couldn't do that to my wife and kids" and indicated the statement was a reflection of his anger and frustration.  Young Berry, RN, BSN, [facility] Head & Neck Oncology Navigator [phone]

## 2013-03-27 NOTE — Progress Notes (Signed)
Talked with pt prior to IMRT. He stated that actions taken by IR the other day resolved leakage and he is now eating/drinking per normal. He expressed that he has no further concerns at this time. Will continue to follow.   Young Berry, RN, BSN, [facility]  Head & Neck Oncology Navigator  [phone]

## 2013-03-27 NOTE — Progress Notes (Signed)
Met with patient and his dtr Fleet Contras prior to his IMRT as f/u to his visit yesterday.   I relayed message that Dr. Mitzi Hansen had called in an Rx for Celexa and that he could increase Xanax to 1 mg per their discussion yesterday.  Pt indicated his wife was already aware of the Rx and was picking it up.  Pt stated that he felt much better yesterday after "blowing off some steam".  I again encourage him to give me a call at any time with questions or concerns.  Pt and his dtr indicated understanding.  Young Berry, RN, BSN, [facility] Head & Neck Oncology Navigator [phone]

## 2013-03-28 ENCOUNTER — Ambulatory Visit
Admission: RE | Admit: 2013-03-28 | Discharge: 2013-03-28 | Disposition: A | Discharge status: Home / Self Care | Payer: Medicare Other | Source: Ambulatory Visit | Attending: Radiation Oncology | Admitting: Radiation Oncology

## 2013-03-28 ENCOUNTER — Encounter: Payer: Self-pay | Admitting: Nutrition

## 2013-03-28 NOTE — Progress Notes (Signed)
Patient did not show for scheduled nutrition appointment.  I will continue to attempt to followup with patient.

## 2013-03-28 NOTE — Progress Notes (Signed)
Attempted to meet with patient today after radiation treatment.  Unfortunately, patient was treated and left before I could meet with him.  Radiation therapists reported they felt patient was doing well.  Will encourage navigator, who meets with patient regularly, to remind patient he can contact me for questions or concerns.

## 2013-03-31 ENCOUNTER — Ambulatory Visit
Admission: RE | Admit: 2013-03-31 | Discharge: 2013-03-31 | Disposition: A | Discharge status: Home / Self Care | Payer: Medicare Other | Source: Ambulatory Visit | Attending: Radiation Oncology | Admitting: Radiation Oncology

## 2013-04-01 ENCOUNTER — Ambulatory Visit
Admission: RE | Admit: 2013-04-01 | Discharge: 2013-04-01 | Disposition: A | Discharge status: Home / Self Care | Payer: Medicare Other | Source: Ambulatory Visit | Attending: Radiation Oncology | Admitting: Radiation Oncology

## 2013-04-01 ENCOUNTER — Other Ambulatory Visit (HOSPITAL_BASED_OUTPATIENT_CLINIC_OR_DEPARTMENT_OTHER): Payer: Medicare Other | Admitting: Lab

## 2013-04-01 DIAGNOSIS — C029 Malignant neoplasm of tongue, unspecified: Secondary | ICD-10-CM

## 2013-04-01 LAB — BASIC METABOLIC PANEL (CC13)
CO2: 31 mEq/L — ABNORMAL HIGH (ref 22–29)
Chloride: 97 mEq/L — ABNORMAL LOW (ref 98–109)
Sodium: 133 mEq/L — ABNORMAL LOW (ref 136–145)

## 2013-04-01 LAB — CBC WITH DIFFERENTIAL/PLATELET
BASO%: 1.3 % (ref 0.0–2.0)
MCHC: 35 g/dL (ref 32.0–36.0)
MONO#: 0.2 10*3/uL (ref 0.1–0.9)
RBC: 2.54 10*6/uL — ABNORMAL LOW (ref 4.20–5.82)
WBC: 1.9 10*3/uL — ABNORMAL LOW (ref 4.0–10.3)
lymph#: 0.3 10*3/uL — ABNORMAL LOW (ref 0.9–3.3)

## 2013-04-02 ENCOUNTER — Ambulatory Visit
Admission: RE | Admit: 2013-04-02 | Discharge: 2013-04-02 | Disposition: A | Discharge status: Home / Self Care | Payer: Medicare Other | Source: Ambulatory Visit | Attending: Radiation Oncology | Admitting: Radiation Oncology

## 2013-04-02 DIAGNOSIS — C029 Malignant neoplasm of tongue, unspecified: Secondary | ICD-10-CM

## 2013-04-02 MED ORDER — BIAFINE EX EMUL
CUTANEOUS | Status: DC | PRN
  Administered 2013-04-02: 15:00:00 via TOPICAL

## 2013-04-03 ENCOUNTER — Ambulatory Visit
Admission: RE | Admit: 2013-04-03 | Discharge: 2013-04-03 | Disposition: A | Discharge status: Home / Self Care | Payer: Medicare Other | Source: Ambulatory Visit | Attending: Radiation Oncology | Admitting: Radiation Oncology

## 2013-04-04 ENCOUNTER — Ambulatory Visit
Admission: RE | Admit: 2013-04-04 | Discharge: 2013-04-04 | Disposition: A | Discharge status: Home / Self Care | Payer: Medicare Other | Source: Ambulatory Visit | Attending: Radiation Oncology | Admitting: Radiation Oncology

## 2013-04-04 ENCOUNTER — Ambulatory Visit (HOSPITAL_BASED_OUTPATIENT_CLINIC_OR_DEPARTMENT_OTHER): Payer: Medicare Other | Admitting: Hematology and Oncology

## 2013-04-04 VITALS — BP 105/68 | HR 80 | Temp 98.3°F | Resp 18 | Ht 68.0 in | Wt 146.7 lb

## 2013-04-04 VITALS — BP 122/75 | HR 69 | Temp 98.2°F | Ht 68.0 in | Wt 137.7 lb

## 2013-04-04 DIAGNOSIS — C029 Malignant neoplasm of tongue, unspecified: Secondary | ICD-10-CM

## 2013-04-04 NOTE — Progress Notes (Signed)
[name] here for weekly under treat visit.  He has had 23 fractions to his tongule, bilateral neck.  He denies pain.  He does have 2 sores on his tongue.  He is able to eat soft foods and liquids.  He has lost 9 lbs since 8/29.  He denies dizziness with standing.  He denies thick saliva.  He does have a dry mouth.  The skin on his neck is red and peeling.  He is using biafine cream and needs a refill.  Another tube has been given.  He does have fatigue.  He also has notice hearing changes and feels like he is listening in a tunnel.  His voice has changed also.

## 2013-04-04 NOTE — Progress Notes (Signed)
Department of Radiation Oncology  Phone:  [phone] Fax:        [phone]  Weekly Treatment Note    Name: Brent [name] Date: 04/04/2013 MRN: [id number] DOB: 19-May-1951   Current dose: 46 Gy  Current fraction: 23   MEDICATIONS: Current Outpatient Prescriptions  Medication Sig Dispense Refill  . ALPRAZolam (XANAX) 0.5 MG tablet Take 0.5 mg by mouth daily as needed for anxiety.      . Alum & Mag Hydroxide-Simeth (MAGIC MOUTHWASH W/LIDOCAINE) SOLN Take 15 mLs by mouth 4 (four) times daily as needed (discomfort).  500 mL  5  . citalopram (CELEXA) 20 MG tablet Take 1 tablet (20 mg total) by mouth daily.  30 tablet  2  . cyclobenzaprine (FLEXERIL) 10 MG tablet Take 10 mg by mouth daily as needed for muscle spasms.       Marland Kitchen docusate sodium (COLACE) 100 MG capsule Take 100 mg by mouth daily as needed for constipation.      Marland Kitchen emollient (BIAFINE) cream Apply 1 application topically 2 (two) times daily. Apply after rad tx and bedtime daily and on weekends as well      . ibuprofen (ADVIL,MOTRIN) 400 MG tablet Take 1 tablet (400 mg total) by mouth every 6 (six) hours as needed.  30 tablet  0  . LORazepam (ATIVAN) 0.5 MG tablet Take 1 tablet (0.5 mg total) by mouth every 6 (six) hours as needed (Nausea or vomiting).  30 tablet  1  . morphine (MS CONTIN) 100 MG 12 hr tablet 100 mg 2 (two) times daily.       . Multiple Vitamin (MULTIVITAMIN WITH MINERALS) TABS Take 1 tablet by mouth daily.      . ondansetron (ZOFRAN) 8 MG tablet Take 1 tablet (8 mg total) by mouth every 12 (twelve) hours as needed. Take two times a day as needed for nausea or vomiting starting on the third day after chemotherapy.  30 tablet  1  . oxycodone (ROXICODONE) 30 MG immediate release tablet Take 30 mg by mouth every 6 (six) hours as needed for pain. Take 1-3 tabs q 6 hrs prn pain      . prochlorperazine (COMPAZINE) 10 MG tablet Take 1 tablet (10 mg total) by mouth every 6 (six) hours as needed (Nausea or vomiting).   30 tablet  1  . simvastatin (ZOCOR) 10 MG tablet Take 10 mg by mouth at bedtime.      Marland Kitchen lisinopril (PRINIVIL,ZESTRIL) 20 MG tablet Take 20 mg by mouth every morning.        No current facility-administered medications for this encounter.     ALLERGIES: Review of patient's allergies indicates no known allergies.   LABORATORY DATA:  Lab Results  Component Value Date   WBC 1.9* 04/01/2013   HGB 7.8* 04/01/2013   HCT 22.3* 04/01/2013   MCV 87.6 04/01/2013   PLT 123* 04/01/2013   Lab Results  Component Value Date   NA 133* 04/01/2013   K 4.3 04/01/2013   CL 100 02/18/2013   CO2 31* 04/01/2013   Lab Results  Component Value Date   ALT 13 03/14/2013   AST 17 03/14/2013   ALKPHOS 69 03/14/2013   BILITOT 0.25 03/14/2013     NARRATIVE: Brent [name] was seen today for weekly treatment management. The chart was checked and the patient's films were reviewed.  The patient states that he is feeling okay. He actually stated that he thought it was recover towards the in. He does  have some soreness in the mouth and some skin irritation.  PHYSICAL EXAMINATION: height is 5\' 8"  (1.727 m) and weight is 137 lb 11.2 oz (62.46 kg). His temperature is 98.2 F (36.8 C). His blood pressure is 122/75 and his pulse is 69. His oxygen saturation is 97%.      some mucositis present within the oral cavity with a couple of sores. He states that the pain is manageable at this time. Diffuse radiation change bilaterally in the neck with some dry desquamation.  ASSESSMENT: The patient is doing satisfactorily with treatment. The patient is in good spirits currently.  PLAN: We will continue with the patient's radiation treatment as planned. No changes to his regimen may today.

## 2013-04-07 ENCOUNTER — Ambulatory Visit (HOSPITAL_BASED_OUTPATIENT_CLINIC_OR_DEPARTMENT_OTHER): Payer: Medicare Other

## 2013-04-07 ENCOUNTER — Other Ambulatory Visit: Payer: Self-pay | Admitting: Hematology and Oncology

## 2013-04-07 ENCOUNTER — Ambulatory Visit
Admission: RE | Admit: 2013-04-07 | Discharge: 2013-04-07 | Disposition: A | Discharge status: Home / Self Care | Payer: Medicare Other | Source: Ambulatory Visit | Attending: Radiation Oncology | Admitting: Radiation Oncology

## 2013-04-07 ENCOUNTER — Other Ambulatory Visit (HOSPITAL_BASED_OUTPATIENT_CLINIC_OR_DEPARTMENT_OTHER): Payer: Medicare Other

## 2013-04-07 ENCOUNTER — Ambulatory Visit: Payer: Medicare Other | Admitting: Nutrition

## 2013-04-07 VITALS — BP 138/87 | HR 77 | Temp 98.0°F | Resp 20

## 2013-04-07 DIAGNOSIS — C029 Malignant neoplasm of tongue, unspecified: Secondary | ICD-10-CM

## 2013-04-07 DIAGNOSIS — Z5111 Encounter for antineoplastic chemotherapy: Secondary | ICD-10-CM

## 2013-04-07 LAB — MAGNESIUM (CC13): Magnesium: 1.9 mg/dl (ref 1.5–2.5)

## 2013-04-07 LAB — CBC WITH DIFFERENTIAL/PLATELET
HCT: 25.3 % — ABNORMAL LOW (ref 38.4–49.9)
MCH: 30.1 pg (ref 27.2–33.4)
MCHC: 33.6 g/dL (ref 32.0–36.0)
MCV: 89.7 fL (ref 79.3–98.0)
MONO#: 0.3 10*3/uL (ref 0.1–0.9)
NEUT%: 63.8 % (ref 39.0–75.0)
Platelets: 190 10*3/uL (ref 140–400)
nRBC: 0 % (ref 0–0)

## 2013-04-07 LAB — BASIC METABOLIC PANEL (CC13)
CO2: 32 mEq/L — ABNORMAL HIGH (ref 22–29)
Glucose: 111 mg/dl (ref 70–140)
Potassium: 4.2 mEq/L (ref 3.5–5.1)
Sodium: 135 mEq/L — ABNORMAL LOW (ref 136–145)

## 2013-04-07 MED ORDER — PALONOSETRON HCL INJECTION 0.25 MG/5ML
INTRAVENOUS | Status: AC
  Filled 2013-04-07: qty 5

## 2013-04-07 MED ORDER — POTASSIUM CHLORIDE 2 MEQ/ML IV SOLN
Freq: Once | INTRAVENOUS | Status: AC
  Administered 2013-04-07: 10:00:00 via INTRAVENOUS
  Filled 2013-04-07: qty 10

## 2013-04-07 MED ORDER — PALONOSETRON HCL INJECTION 0.25 MG/5ML
0.2500 mg | Freq: Once | INTRAVENOUS | Status: AC
  Administered 2013-04-07: 0.25 mg via INTRAVENOUS

## 2013-04-07 MED ORDER — SODIUM CHLORIDE 0.9 % IV SOLN
150.0000 mg | Freq: Once | INTRAVENOUS | Status: AC
  Administered 2013-04-07: 150 mg via INTRAVENOUS
  Filled 2013-04-07: qty 5

## 2013-04-07 MED ORDER — SODIUM CHLORIDE 0.9 % IV SOLN
100.0000 mg/m2 | Freq: Once | INTRAVENOUS | Status: AC
  Administered 2013-04-07: 178 mg via INTRAVENOUS
  Filled 2013-04-07: qty 178

## 2013-04-07 MED ORDER — SODIUM CHLORIDE 0.9 % IV SOLN
Freq: Once | INTRAVENOUS | Status: AC
  Administered 2013-04-07: 10:00:00 via INTRAVENOUS

## 2013-04-07 MED ORDER — DEXAMETHASONE SODIUM PHOSPHATE 20 MG/5ML IJ SOLN
INTRAMUSCULAR | Status: AC
  Filled 2013-04-07: qty 5

## 2013-04-07 MED ORDER — DEXAMETHASONE SODIUM PHOSPHATE 20 MG/5ML IJ SOLN
12.0000 mg | Freq: Once | INTRAMUSCULAR | Status: AC
  Administered 2013-04-07: 12 mg via INTRAVENOUS

## 2013-04-07 NOTE — Progress Notes (Signed)
Pre Cisplatin void was 650.

## 2013-04-07 NOTE — Patient Instructions (Addendum)
[facility] Discharge Instructions for Patients Receiving Chemotherapy  Today you received the following chemotherapy agents Cisplatin.  To help prevent nausea and vomiting after your treatment, we encourage you to take your nausea medication as prescribed.   If you develop nausea and vomiting that is not controlled by your nausea medication, call the clinic.   BELOW ARE SYMPTOMS THAT SHOULD BE REPORTED IMMEDIATELY:  *FEVER GREATER THAN 100.5 F  *CHILLS WITH OR WITHOUT FEVER  NAUSEA AND VOMITING THAT IS NOT CONTROLLED WITH YOUR NAUSEA MEDICATION  *UNUSUAL SHORTNESS OF BREATH  *UNUSUAL BRUISING OR BLEEDING  TENDERNESS IN MOUTH AND THROAT WITH OR WITHOUT PRESENCE OF ULCERS  *URINARY PROBLEMS  *BOWEL PROBLEMS  UNUSUAL RASH Items with * indicate a potential emergency and should be followed up as soon as possible.  Feel free to call the clinic you have any questions or concerns. The clinic phone number is [phone].    

## 2013-04-07 NOTE — Progress Notes (Signed)
Per Dr. Artis Delay, it's OK to treat today despite counts.

## 2013-04-07 NOTE — Progress Notes (Signed)
Patient not willing to talk with me today.  I did speak with patient's wife, who is at his side, during infusion.  Patient finishes radiation therapy up next Tuesday.  He has lost considerable amount of weight equaling 9% of his body weight in less than 2 months.  Current weight was reflected as 137.7 pounds on September 12.  BMI is 20.94.  Patient refuses to use his feeding tube for nutrition support.  Patient's wife states patient will add extra water to the feeding tube for adequate hydration.  Wife reports patient will drink smoothies and ensure.  He is tolerating some soft foods.  Nutrition diagnosis: Predicted suboptimal energy intake has evolved into inadequate oral intake related to tongue cancer and associated treatments as evidenced by 9% weight loss in less than 2 months.  Intervention: I have encouraged patient's wife to offer patient Ensure Plus or boost plus frequently throughout the day.  We have discussed blenderizing foods and consuming homemade smoothies with extra calories and protein.  I've encouraged increased hydration.  Patient's wife was receptive to nutrition information and verbalizes appreciation for recipes and information.    Monitoring, evaluation, goals: Patient will tolerate increased oral intake to minimize further weight loss and promote healing after treatment.  Next visit: Patient's wife has my phone number and will contact me with questions or concerns.

## 2013-04-08 ENCOUNTER — Ambulatory Visit
Admission: RE | Admit: 2013-04-08 | Discharge: 2013-04-08 | Disposition: A | Discharge status: Home / Self Care | Payer: Medicare Other | Source: Ambulatory Visit | Attending: Radiation Oncology | Admitting: Radiation Oncology

## 2013-04-08 NOTE — Progress Notes (Signed)
ID: Brent [name] OB: Feb 16, 1951  MR#: [id number]  UXL#:[id number]  [facility]  [phone] [phone]   OFFICE PROGRESS NOTE  PCP: Floyde Parkins, MD  DIAGNOSIS: left lingual squamous cell carcinoma; pT1 N2a cM0 (stage IVA)   PAST THERAPY: left hemiglossectomy on 01/23/2013 with left neck dissection. Pathology was notable for negative final margin; 2/23 nodes positive without extracapsular extension; but positive for lymphovascular invasion.   CURRENT THERAPY: Concurrent adjuvant chemoradiation with cisplatin 100 mg/m2 Q3W.   INTERVAL HISTORY:  Brent [name] 62 y.o. male returns for regular follow up visit.He can eat soft food and liquids. He continue to feel tired. He complains on muffled hearing.  His appetite is not good. His nausea is under control with medication.The patient denied fever, chills, night sweats. He denied headaches, double vision, blurry vision, nasal congestion, nasal discharge, No chest pain, palpitations, dyspnea, cough, abdominal pain, nausea, vomiting, diarrhea, constipation, hematochezia. The patient denied dysuria, nocturia, polyuria, hematuria, myalgia, numbness, tingling, psychiatric problems.  Review of Systems  Constitutional: Positive for weight loss and malaise/fatigue. Negative for fever, chills and diaphoresis.  HENT: Positive for sore throat. Negative for hearing loss, ear pain, nosebleeds, congestion, neck pain and tinnitus.   Eyes: Negative for blurred vision, double vision, photophobia and pain.  Respiratory: Negative for cough, hemoptysis, sputum production, shortness of breath and wheezing.   Cardiovascular: Negative for chest pain, palpitations, orthopnea, claudication, leg swelling and PND.  Gastrointestinal: Negative for heartburn, nausea, vomiting, abdominal pain, diarrhea, constipation, blood in stool and melena.  Genitourinary: Negative for dysuria, urgency, frequency, hematuria and flank pain.    Musculoskeletal: Negative for myalgias, back pain, joint pain and falls.  Skin: Negative for itching and rash.  Neurological: Negative for dizziness, tingling, tremors, sensory change, speech change, focal weakness, seizures, loss of consciousness, weakness and headaches.  Endo/Heme/Allergies: Does not bruise/bleed easily.  Psychiatric/Behavioral: Negative.     PAST MEDICAL HISTORY: Past Medical History  Diagnosis Date  . Arthritis   . DVT (deep venous thrombosis) 2006    associated with surgery  . PE (pulmonary embolism) 2006    associated with surgery  . Diverticulosis   . [place]     [facility]  [place] [facility]  . Tongue cancer     PAST SURGICAL HISTORY: Past Surgical History  Procedure Laterality Date  . Knee surgery Left   . Above knee leg amputation Left 2006    Dr. Renae Fickle  . Colonoscopy  2011  . Direct laryngoscopy N/A 01/23/2013    Procedure: DIRECT LARYNGOSCOPY; RIGID BRONCHOSCOPY;  Surgeon: [name], MD;  Location: [facility] OR;  Service: ENT;  Laterality: N/A;  . Glossectomy Left 01/23/2013    Procedure: HEMIGLOSSECTOMY;  Surgeon: [name], MD;  Location: [facility] OR;  Service: ENT;  Laterality: Left;  . Radical neck dissection Left 01/23/2013    Procedure: LEFT SELECTIVE NECK DISSECTION;  Surgeon: [name], MD;  Location: [facility] OR;  Service: ENT;  Laterality: Left;  . Esophagoscopy N/A 01/23/2013    Procedure: ESOPHAGOSCOPY;  Surgeon: [name], MD;  Location: [facility] OR;  Service: ENT;  Laterality: N/A;    FAMILY HISTORY Family History  Problem Relation Age of Onset  . Prostate cancer    . Asthma Son   . Arthritis/Rheumatoid Mother   . Colon cancer Father 12    GYNECOLOGIC HISTORY:   SOCIAL HISTORY:      ADVANCED DIRECTIVES:    HEALTH MAINTENANCE: History  Substance Use Topics  . Smoking status: Former  Smoker -- 1.00 packs/day for 30 years    Quit date: 12/22/2012  . Smokeless tobacco: Never Used  . Alcohol Use: Yes     Comment: one beer or bourbon drink  per week     Colonoscopy:  PAP:  Bone density:  Lipid panel:  No Known Allergies  Current Outpatient Prescriptions  Medication Sig Dispense Refill  . ALPRAZolam (XANAX) 0.5 MG tablet Take 0.5 mg by mouth daily as needed for anxiety.      . Alum & Mag Hydroxide-Simeth (MAGIC MOUTHWASH W/LIDOCAINE) SOLN Take 15 mLs by mouth 4 (four) times daily as needed (discomfort).  500 mL  5  . citalopram (CELEXA) 20 MG tablet Take 1 tablet (20 mg total) by mouth daily.  30 tablet  2  . cyclobenzaprine (FLEXERIL) 10 MG tablet Take 10 mg by mouth daily as needed for muscle spasms.       Marland Kitchen docusate sodium (COLACE) 100 MG capsule Take 100 mg by mouth daily as needed for constipation.      Marland Kitchen emollient (BIAFINE) cream Apply 1 application topically 2 (two) times daily. Apply after rad tx and bedtime daily and on weekends as well      . ibuprofen (ADVIL,MOTRIN) 400 MG tablet Take 1 tablet (400 mg total) by mouth every 6 (six) hours as needed.  30 tablet  0  . LORazepam (ATIVAN) 0.5 MG tablet Take 1 tablet (0.5 mg total) by mouth every 6 (six) hours as needed (Nausea or vomiting).  30 tablet  1  . morphine (MS CONTIN) 100 MG 12 hr tablet 100 mg 2 (two) times daily.       . Multiple Vitamin (MULTIVITAMIN WITH MINERALS) TABS Take 1 tablet by mouth daily.      . ondansetron (ZOFRAN) 8 MG tablet Take 1 tablet (8 mg total) by mouth every 12 (twelve) hours as needed. Take two times a day as needed for nausea or vomiting starting on the third day after chemotherapy.  30 tablet  1  . oxycodone (ROXICODONE) 30 MG immediate release tablet Take 30 mg by mouth every 6 (six) hours as needed for pain. Take 1-3 tabs q 6 hrs prn pain      . prochlorperazine (COMPAZINE) 10 MG tablet Take 1 tablet (10 mg total) by mouth every 6 (six) hours as needed (Nausea or vomiting).  30 tablet  1  . simvastatin (ZOCOR) 10 MG tablet Take 10 mg by mouth at bedtime.      Marland Kitchen lisinopril (PRINIVIL,ZESTRIL) 20 MG tablet Take 20 mg by mouth every  morning.        No current facility-administered medications for this visit.    OBJECTIVE: Filed Vitals:   04/04/13 1427  BP: 105/68  Pulse: 80  Temp: 98.3 F (36.8 C)  Resp: 18     Body mass index is 22.31 kg/(m^2).    ECOG FS: 0  PHYSICAL EXAMINATION:  HEENT: Sclerae anicteric.  Conjunctivae were pink. Pupils round and reactive bilaterally. Oral mucosa is moist without  or thrush.He has two shallow ulcerations. Neck is red. No occipital, submandibular, cervical, supraclavicular or axillar adenopathy. Lungs: clear to auscultation without wheezes. No rales or rhonchi. Heart: regular rate and rhythm. No murmur, gallop or rubs. Abdomen: soft, non tender. No guarding or rebound tenderness. Bowel sounds are present. No palpable hepatosplenomegaly. MSK: no focal spinal tenderness. Extremities: No clubbing or cyanosis.No calf tenderness to palpitation, no peripheral edema. The patient had grossly intact strength in upper and lower extremities. Skin exam  was without ecchymosis, petechiae. Neuro: non-focal, alert and oriented to time, person and place, appropriate affect  LAB RESULTS:  CMP     Component Value Date/Time   NA 135* 04/07/2013 0855   NA 136 02/18/2013 1200   K 4.2 04/07/2013 0855   K 4.6 02/18/2013 1200   CL 100 02/18/2013 1200   CL 106 01/10/2013 1421   CO2 32* 04/07/2013 0855   CO2 27 02/18/2013 1200   GLUCOSE 111 04/07/2013 0855   GLUCOSE 97 02/18/2013 1200   GLUCOSE 113* 01/10/2013 1421   BUN 13.6 04/07/2013 0855   BUN 52* 02/18/2013 1200   CREATININE 0.9 04/07/2013 0855   CREATININE 1.00 02/18/2013 1200   CALCIUM 9.2 04/07/2013 0855   CALCIUM 9.4 02/18/2013 1200   PROT 6.4 03/14/2013 0757   ALBUMIN 3.2* 03/14/2013 0757   AST 17 03/14/2013 0757   ALT 13 03/14/2013 0757   ALKPHOS 69 03/14/2013 0757   BILITOT 0.25 03/14/2013 0757   GFRNONAA 78* 02/18/2013 1200   GFRAA >90 02/18/2013 1200    I No results found for this basename: SPEP, UPEP,  kappa and lambda light chains     Lab Results  Component Value Date   WBC 2.2* 04/07/2013   NEUTROABS 1.4* 04/07/2013   HGB 8.5* 04/07/2013   HCT 25.3* 04/07/2013   MCV 89.7 04/07/2013   PLT 190 04/07/2013      Chemistry      Component Value Date/Time   NA 135* 04/07/2013 0855   NA 136 02/18/2013 1200   K 4.2 04/07/2013 0855   K 4.6 02/18/2013 1200   CL 100 02/18/2013 1200   CL 106 01/10/2013 1421   CO2 32* 04/07/2013 0855   CO2 27 02/18/2013 1200   BUN 13.6 04/07/2013 0855   BUN 52* 02/18/2013 1200   CREATININE 0.9 04/07/2013 0855   CREATININE 1.00 02/18/2013 1200      Component Value Date/Time   CALCIUM 9.2 04/07/2013 0855   CALCIUM 9.4 02/18/2013 1200   ALKPHOS 69 03/14/2013 0757   AST 17 03/14/2013 0757   ALT 13 03/14/2013 0757   BILITOT 0.25 03/14/2013 0757    Urinalysis    Component Value Date/Time   LABSPEC 1.015 03/14/2013 1118   GLUCOSEU Negative 03/14/2013 1118   UROBILINOGEN 0.2 03/14/2013 1118    STUDIES: No results found.  ASSESSMENT AND PLAN: 1. Squamous cell carcinoma of tongue:  - Primary location: Left tongue  - Stage: Stage IVA (due to presence of two lymph nodes).  - Continue combine chemotherapy and radiation to the tongue and the neck with chemotherapy  - Proceed with cycle 3 of cisplatin 100 mg/m2 on 04/07/13.  2. Follow up: In 3 weeks   Myra Rude, MD   04/05/2013 2:40 AM

## 2013-04-09 ENCOUNTER — Ambulatory Visit
Admission: RE | Admit: 2013-04-09 | Discharge: 2013-04-09 | Disposition: A | Discharge status: Home / Self Care | Payer: Medicare Other | Source: Ambulatory Visit | Attending: Radiation Oncology | Admitting: Radiation Oncology

## 2013-04-09 ENCOUNTER — Encounter: Payer: Self-pay | Admitting: *Deleted

## 2013-04-09 ENCOUNTER — Other Ambulatory Visit: Payer: Self-pay | Admitting: *Deleted

## 2013-04-09 ENCOUNTER — Ambulatory Visit: Payer: Medicare Other

## 2013-04-09 NOTE — Progress Notes (Signed)
Met with pt and his wife after scheduled IMRT as routine encounter.  Pt indicated he was going to see Dr. Mitzi Hansen tomorrow to check out "hard spot" on his jaw.   Pt indicated that he is happy that only 4 more tmts.  D/t previous concerns pt has expressed about not being of appts, I checked his awareness of tomorrow's 1:00 appt with Dr. Bertis Ruddy who has taken over his medonc care.   Pt and wife indicated they did not know about appt.  At pt's request, I facilitated rescheduling of appt to more closely approximate RT scheduled for 3:05.  RN Cameo placed request to Scheduling for a 2:00 appt; I called and spoke with pt wife who expressed understanding.  Continuing to navigate at L2.  Young Berry, RN, BSN, [facility] Head & Neck Oncology Navigator [phone]

## 2013-04-10 ENCOUNTER — Ambulatory Visit (HOSPITAL_BASED_OUTPATIENT_CLINIC_OR_DEPARTMENT_OTHER): Payer: Medicare Other | Admitting: Hematology and Oncology

## 2013-04-10 ENCOUNTER — Encounter: Payer: Self-pay | Admitting: Hematology and Oncology

## 2013-04-10 ENCOUNTER — Ambulatory Visit
Admission: RE | Admit: 2013-04-10 | Discharge: 2013-04-10 | Disposition: A | Discharge status: Home / Self Care | Payer: Medicare Other | Source: Ambulatory Visit | Attending: Radiation Oncology | Admitting: Radiation Oncology

## 2013-04-10 ENCOUNTER — Telehealth: Payer: Self-pay | Admitting: Hematology and Oncology

## 2013-04-10 VITALS — BP 133/87 | HR 74 | Temp 97.9°F | Resp 20 | Ht 68.0 in | Wt 134.4 lb

## 2013-04-10 DIAGNOSIS — K121 Other forms of stomatitis: Secondary | ICD-10-CM

## 2013-04-10 DIAGNOSIS — R634 Abnormal weight loss: Secondary | ICD-10-CM

## 2013-04-10 DIAGNOSIS — M7989 Other specified soft tissue disorders: Secondary | ICD-10-CM

## 2013-04-10 DIAGNOSIS — C029 Malignant neoplasm of tongue, unspecified: Secondary | ICD-10-CM

## 2013-04-10 MED ORDER — BIAFINE EX EMUL
Freq: Two times a day (BID) | CUTANEOUS | Status: DC
  Administered 2013-04-10: 15:00:00 via TOPICAL

## 2013-04-10 NOTE — Progress Notes (Signed)
[facility] OFFICE PROGRESS NOTE  [name],[name], MD No address on file No chief complaint on file.   DIAGNOSIS: Left lingual squamous cell carcinoma, T1 N2aM0  SUMMARY OF ONCOLOGIC HISTORY: I have reviewed his chart extensively. This patient was diagnosed with invasive tongue cancer. On 01/23/2013 he underwent resection of the tumor along with left neck dissection. Pathology showed negative margins but 2 lymph nodes were positive, without extracapsular extension but with lymphovascular invasion. The patient was offered adjuvant treatment with concurrent chemoradiation therapy with cisplatin. His last dose of cisplatin was on 04/07/2013. Anticipate the last dose of radiation would be on 9/23  INTERVAL HISTORY: Brent [name] 62 y.o. male returns for routine followup regarding his tongue cancer. The patient is concerned about some mild facial swelling on the left. Is not bothering him. He continued to have mild weight loss 2 difficulty swallowing food and poor appetite. He complained of persistent dry mouth and loss of taste sensation. He is refusing to use his feeding tube as he does not think he needs it. He is eating soft foods at home and also 2 cans of nutritional supplement with Ensure daily. He had mild constipation resolved with laxative. He has pain medication regularly and is controlling his pain well he denies any hearing problems and denies any peripheral neuropathy.  I have reviewed the past medical history, past surgical history, social history and family history with the patient and they are unchanged from previous note.  ALLERGIES:  has No Known Allergies.  MEDICATIONS: has a current medication list which includes the following prescription(s): alprazolam, magic mouthwash w/lidocaine, citalopram, cyclobenzaprine, docusate sodium, emollient, ibuprofen, lisinopril, lorazepam, morphine, multivitamin with minerals, ondansetron, oxycodone, prochlorperazine, and  simvastatin, and the following Facility-Administered Medications: topical emolient.  REVIEW OF SYSTEMS:   Constitutional: Denies fevers, chills or abnormal weight loss Eyes: Denies blurriness of vision Ears, nose, mouth, throat, and face: He complained of mild mucositis. Respiratory: Denies cough, dyspnea or wheezes Cardiovascular: Denies palpitation, chest discomfort or lower extremity swelling Gastrointestinal:  Denies nausea, heartburn or change in bowel habits Skin: Denies abnormal skin rashes Lymphatics: Denies new lymphadenopathy or easy bruising Neurological:Denies numbness, tingling or new weaknesses Behavioral/Psych: Mood is stable, no new changes  All other systems were reviewed with the patient and are negative.  PHYSICAL EXAMINATION: ECOG PERFORMANCE STATUS: 1 - Symptomatic but completely ambulatory  Filed Vitals:   04/10/13 1404  BP: 133/87  Pulse: 74  Temp: 97.9 F (36.6 C)  Resp: 20    GENERAL:alert, no distress and comfortable SKIN: skin color, texture, turgor are normal there is significant radiation induced skin injury with mild ulceration around his neck.Marland Kitchen  EYES: normal, Conjunctiva are pink and non-injected, sclera clear OROPHARYNX:no exudate, no erythema and lips, buccal mucosa, and there is presence of mucositis but no thrush NECK: supple, thyroid normal size, non-tender, without nodularity. The area of concern on his left face is not associated with any mass,it's just representative of new skin  LYMPH:  no palpable lymphadenopathy in the cervical, axillary or inguinal LUNGS: clear to auscultation and percussion with normal breathing effort HEART: regular rate & rhythm and no murmurs and no lower extremity edema ABDOMEN:abdomen soft, non-tender and normal bowel sounds Musculoskeletal:no cyanosis of digits and no clubbing  NEURO: alert & oriented x 3 with fluent speech. There is presence of mild left facial nerve palsy   LABORATORY DATA:  I have reviewed  the data as listed    Component Value Date/Time   NA 135*  04/07/2013 0855   NA 136 02/18/2013 1200   K 4.2 04/07/2013 0855   K 4.6 02/18/2013 1200   CL 100 02/18/2013 1200   CL 106 01/10/2013 1421   CO2 32* 04/07/2013 0855   CO2 27 02/18/2013 1200   GLUCOSE 111 04/07/2013 0855   GLUCOSE 97 02/18/2013 1200   GLUCOSE 113* 01/10/2013 1421   BUN 13.6 04/07/2013 0855   BUN 52* 02/18/2013 1200   CREATININE 0.9 04/07/2013 0855   CREATININE 1.00 02/18/2013 1200   CALCIUM 9.2 04/07/2013 0855   CALCIUM 9.4 02/18/2013 1200   PROT 6.4 03/14/2013 0757   ALBUMIN 3.2* 03/14/2013 0757   AST 17 03/14/2013 0757   ALT 13 03/14/2013 0757   ALKPHOS 69 03/14/2013 0757   BILITOT 0.25 03/14/2013 0757   GFRNONAA 78* 02/18/2013 1200   GFRAA >90 02/18/2013 1200    I No results found for this basename: SPEP, UPEP,  kappa and lambda light chains    Lab Results  Component Value Date   WBC 2.2* 04/07/2013   NEUTROABS 1.4* 04/07/2013   HGB 8.5* 04/07/2013   HCT 25.3* 04/07/2013   MCV 89.7 04/07/2013   PLT 190 04/07/2013      Chemistry      Component Value Date/Time   NA 135* 04/07/2013 0855   NA 136 02/18/2013 1200   K 4.2 04/07/2013 0855   K 4.6 02/18/2013 1200   CL 100 02/18/2013 1200   CL 106 01/10/2013 1421   CO2 32* 04/07/2013 0855   CO2 27 02/18/2013 1200   BUN 13.6 04/07/2013 0855   BUN 52* 02/18/2013 1200   CREATININE 0.9 04/07/2013 0855   CREATININE 1.00 02/18/2013 1200      Component Value Date/Time   CALCIUM 9.2 04/07/2013 0855   CALCIUM 9.4 02/18/2013 1200   ALKPHOS 69 03/14/2013 0757   AST 17 03/14/2013 0757   ALT 13 03/14/2013 0757   BILITOT 0.25 03/14/2013 0757     ASSESSMENT: Stage IV colon cancer, undergoing adjuvant treatment is to #1 left tongue cancer He has completed chemotherapy this week. He will finish radiation therapy on 04/15/2013. We will continue her treatment without dosage adjustment or delay, and will continue aggressive supportive care #2 mucositis  This is expected from side effects of  radiation treatment. We'll continue conservative management #3 weight loss  I recommend you increase his oral intake and use his feeding tube. The patient declined the use of feeding tube for now  #4 left facial swelling Reassured the patient this is not to a mass. Rather it is secondary to loose skin. We will observe. #5 mild left facial nerve palsy This is likely related to recent surgery and radiation treatment. We will observe closely.  #6 pain We discussed about medication refill. He will check and make sure you get enough pain medication to loss over the weekend.  #7 mild constipation I recommend he continue with stool softener and laxatives as needed.  All questions were answered. The patient knows to call the clinic with any problems, questions or concerns. We can certainly see the patient much sooner if necessary. No barriers to learning was detected.  The patient and plan discussed with [facility], Thoma Paulsen  and he is in agreement with the aforementioned.  I spent 40 minutes counseling the patient face to face. The total time spent in the appointment was 60 minutes and more than 50% was on counseling.     [facility], Ollen Rao, MD 04/10/2013 4:32 PM

## 2013-04-11 ENCOUNTER — Ambulatory Visit
Admission: RE | Admit: 2013-04-11 | Discharge: 2013-04-11 | Disposition: A | Discharge status: Home / Self Care | Payer: Medicare Other | Source: Ambulatory Visit | Attending: Radiation Oncology | Admitting: Radiation Oncology

## 2013-04-11 ENCOUNTER — Ambulatory Visit: Payer: Medicare Other | Admitting: Radiation Oncology

## 2013-04-11 ENCOUNTER — Encounter: Payer: Self-pay | Admitting: *Deleted

## 2013-04-11 NOTE — Progress Notes (Signed)
To provide care continuity for patient and MedOnc, met with patient for his first visit with Dr. Bertis Ruddy.  Obtained new supply of Biofine for pt while he was scheduling f/u appts.  Will continue to navigate as L2 (established) patient.  Young Berry, RN, BSN, [facility] Navigator [phone]

## 2013-04-14 ENCOUNTER — Ambulatory Visit
Admission: RE | Admit: 2013-04-14 | Discharge: 2013-04-14 | Disposition: A | Discharge status: Home / Self Care | Payer: Medicare Other | Source: Ambulatory Visit | Attending: Radiation Oncology | Admitting: Radiation Oncology

## 2013-04-14 ENCOUNTER — Ambulatory Visit: Payer: Medicare Other

## 2013-04-14 DIAGNOSIS — C029 Malignant neoplasm of tongue, unspecified: Secondary | ICD-10-CM

## 2013-04-14 MED ORDER — OXYCODONE HCL 30 MG PO TABS: 30.0000 mg | ORAL_TABLET | Freq: Four times a day (QID) | ORAL | Status: DC | PRN

## 2013-04-15 ENCOUNTER — Encounter: Payer: Self-pay | Admitting: *Deleted

## 2013-04-15 ENCOUNTER — Encounter: Payer: Self-pay | Admitting: Radiation Oncology

## 2013-04-15 ENCOUNTER — Ambulatory Visit
Admission: RE | Admit: 2013-04-15 | Discharge: 2013-04-15 | Disposition: A | Discharge status: Home / Self Care | Payer: Medicare Other | Source: Ambulatory Visit | Attending: Radiation Oncology | Admitting: Radiation Oncology

## 2013-04-15 DIAGNOSIS — C029 Malignant neoplasm of tongue, unspecified: Secondary | ICD-10-CM

## 2013-04-15 MED ORDER — BIAFINE EX EMUL
Freq: Two times a day (BID) | CUTANEOUS | Status: DC
  Administered 2013-04-15: 15:00:00 via TOPICAL

## 2013-04-15 NOTE — Progress Notes (Signed)
E-mailed Otis Peak, to schedule 1 month f/u appt for patient he completed today, didn't come around,ebven though I had spoken with Faith earlier, he stated he had seen MD in hall and didn't need to come around. Per therapists Carollee Herter and Faith 4:08 PM

## 2013-04-15 NOTE — Progress Notes (Signed)
Met with pt and his wife during final RT to offer support and to celebrate end of radiation treatment.  I explained that my role as navigator will continue for several more months and that I will be calling and/or joining them during follow-up visits.  Pt and his wife expressed appreciation.  Young Berry, RN, BSN, [facility] Navigator [phone]

## 2013-04-15 NOTE — Progress Notes (Signed)
Pt completed today, but didn't come around, per shannon and Faith,therapists, he stated"I saw Dr.Moody in the hall yesterday and was given a script", he left without seeing nursing today

## 2013-04-16 NOTE — Progress Notes (Signed)
Department of Radiation Oncology  Phone:  [phone] Fax:        [phone]  Weekly Treatment Note    Name: Brent [name] Date: 04/16/2013 MRN: [id number] DOB: 1951/03/01   Current dose: 58 Gy  Current fraction: 29   MEDICATIONS: Current Outpatient Prescriptions  Medication Sig Dispense Refill  . ALPRAZolam (XANAX) 0.5 MG tablet Take 0.5 mg by mouth daily as needed for anxiety.      . Alum & Mag Hydroxide-Simeth (MAGIC MOUTHWASH W/LIDOCAINE) SOLN Take 15 mLs by mouth 4 (four) times daily as needed (discomfort).  500 mL  5  . citalopram (CELEXA) 20 MG tablet Take 1 tablet (20 mg total) by mouth daily.  30 tablet  2  . cyclobenzaprine (FLEXERIL) 10 MG tablet Take 10 mg by mouth daily as needed for muscle spasms.       Marland Kitchen docusate sodium (COLACE) 100 MG capsule Take 100 mg by mouth daily as needed for constipation.      Marland Kitchen emollient (BIAFINE) cream Apply 1 application topically 2 (two) times daily. Apply after rad tx and bedtime daily and on weekends as well      . ibuprofen (ADVIL,MOTRIN) 400 MG tablet Take 1 tablet (400 mg total) by mouth every 6 (six) hours as needed.  30 tablet  0  . lisinopril (PRINIVIL,ZESTRIL) 20 MG tablet Take 20 mg by mouth every morning.       Marland Kitchen LORazepam (ATIVAN) 0.5 MG tablet Take 1 tablet (0.5 mg total) by mouth every 6 (six) hours as needed (Nausea or vomiting).  30 tablet  1  . morphine (MS CONTIN) 100 MG 12 hr tablet 100 mg 2 (two) times daily.       . Multiple Vitamin (MULTIVITAMIN WITH MINERALS) TABS Take 1 tablet by mouth daily.      . ondansetron (ZOFRAN) 8 MG tablet Take 1 tablet (8 mg total) by mouth every 12 (twelve) hours as needed. Take two times a day as needed for nausea or vomiting starting on the third day after chemotherapy.  30 tablet  1  . oxycodone (ROXICODONE) 30 MG immediate release tablet Take 1 tablet (30 mg total) by mouth every 6 (six) hours as needed for pain. Take 1-3 tabs q 6 hrs prn pain  10 tablet  0  . prochlorperazine  (COMPAZINE) 10 MG tablet Take 1 tablet (10 mg total) by mouth every 6 (six) hours as needed (Nausea or vomiting).  30 tablet  1  . simvastatin (ZOCOR) 10 MG tablet Take 10 mg by mouth at bedtime.       No current facility-administered medications for this encounter.     ALLERGIES: Review of patient's allergies indicates no known allergies.   LABORATORY DATA:  Lab Results  Component Value Date   WBC 2.2* 04/07/2013   HGB 8.5* 04/07/2013   HCT 25.3* 04/07/2013   MCV 89.7 04/07/2013   PLT 190 04/07/2013   Lab Results  Component Value Date   NA 135* 04/07/2013   K 4.2 04/07/2013   CL 100 02/18/2013   CO2 32* 04/07/2013   Lab Results  Component Value Date   ALT 13 03/14/2013   AST 17 03/14/2013   ALKPHOS 69 03/14/2013   BILITOT 0.25 03/14/2013     NARRATIVE: Brent [name] was seen today for weekly treatment management. The chart was checked and the patient's films were reviewed. The patient is nearing completion of his course of radiation. He is clinically stable. He does request a refill  on pain medication. He only needs a couple of days of this because he is seeing his other physicians at the [facility] hospital later this week.  PHYSICAL EXAMINATION:  Skin reaction shows significant radiation effect with dry desquamation diffusely      ASSESSMENT: The patient is doing satisfactorily with treatment.  PLAN: We will continue with the patient's radiation treatment as planned. Followup in one month.

## 2013-04-21 ENCOUNTER — Telehealth: Payer: Self-pay | Admitting: Hematology and Oncology

## 2013-04-21 ENCOUNTER — Ambulatory Visit (HOSPITAL_BASED_OUTPATIENT_CLINIC_OR_DEPARTMENT_OTHER): Payer: Medicare Other | Admitting: Hematology and Oncology

## 2013-04-21 ENCOUNTER — Encounter: Payer: Self-pay | Admitting: Hematology and Oncology

## 2013-04-21 VITALS — BP 116/78 | HR 78 | Temp 98.3°F | Resp 18 | Ht 68.0 in | Wt 131.3 lb

## 2013-04-21 DIAGNOSIS — C029 Malignant neoplasm of tongue, unspecified: Secondary | ICD-10-CM

## 2013-04-21 DIAGNOSIS — R070 Pain in throat: Secondary | ICD-10-CM

## 2013-04-21 DIAGNOSIS — K5900 Constipation, unspecified: Secondary | ICD-10-CM

## 2013-04-21 DIAGNOSIS — K121 Other forms of stomatitis: Secondary | ICD-10-CM

## 2013-04-21 DIAGNOSIS — R634 Abnormal weight loss: Secondary | ICD-10-CM

## 2013-04-21 MED ORDER — OXYCODONE HCL 30 MG PO TABS: 30.0000 mg | ORAL_TABLET | ORAL | Status: DC | PRN

## 2013-04-21 MED ORDER — MORPHINE SULFATE ER 100 MG PO TBCR: 100.0000 mg | EXTENDED_RELEASE_TABLET | Freq: Three times a day (TID) | ORAL | Status: DC

## 2013-04-21 MED ORDER — SENNA 8.6 MG PO TABS: 2.0000 | ORAL_TABLET | Freq: Two times a day (BID) | ORAL | Status: DC

## 2013-04-21 NOTE — Telephone Encounter (Signed)
gv pt appt schedule for October.  °

## 2013-04-21 NOTE — Progress Notes (Signed)
[facility] OFFICE PROGRESS NOTE  [name],[name], MD  DIAGNOSIS: Squamous cell carcinoma of tongue  SUMMARY OF ONCOLOGIC HISTORY:   Squamous cell carcinoma of the left lateral tongue   01/09/2013 Initial Diagnosis Squamous cell carcinoma of the left lateral tongue  On 01/23/2013 he underwent resection of the tumor along with left neck dissection. Pathology showed negative margins but 2 lymph nodes were positive, without extracapsular extension but with lymphovascular invasion. The patient was offered adjuvant treatment with concurrent chemoradiation therapy with cisplatin. His last dose of cisplatin was on 04/07/2013. Anticipate the last dose of radiation would be on 9/23  INTERVAL HISTORY: Brent [name] 62 y.o. male returns for follow-up. He is not doing well. Still refusing to use his feeding tube. He is trying to take "many cans" of Ensure. He lost 3 more pounds of weight. He has uncontrolled pan in his throat. Also has constipation X 3 days. Denies any fevers or chills.  I have reviewed the past medical history, past surgical history, social history and family history with the patient and they are unchanged from previous note.  ALLERGIES:  has No Known Allergies.  MEDICATIONS: Current outpatient prescriptions:ALPRAZolam (XANAX) 0.5 MG tablet, Take 0.5 mg by mouth daily as needed for anxiety., Disp: , Rfl: ;  Alum & Mag Hydroxide-Simeth (MAGIC MOUTHWASH W/LIDOCAINE) SOLN, Take 15 mLs by mouth 4 (four) times daily as needed (discomfort)., Disp: 500 mL, Rfl: 5;  cyclobenzaprine (FLEXERIL) 10 MG tablet, Take 10 mg by mouth daily as needed for muscle spasms. , Disp: , Rfl:  emollient (BIAFINE) cream, Apply 1 application topically 2 (two) times daily. Apply after rad tx and bedtime daily and on weekends as well, Disp: , Rfl: ;  ibuprofen (ADVIL,MOTRIN) 400 MG tablet, Take 1 tablet (400 mg total) by mouth every 6 (six) hours as needed., Disp: 30 tablet, Rfl: 0;  lisinopril  (PRINIVIL,ZESTRIL) 20 MG tablet, Take 20 mg by mouth every morning. , Disp: , Rfl:  morphine (MS CONTIN) 100 MG 12 hr tablet, Take 1 tablet (100 mg total) by mouth 3 (three) times daily., Disp: 90 tablet, Rfl: 0;  Multiple Vitamin (MULTIVITAMIN WITH MINERALS) TABS, Take 1 tablet by mouth daily., Disp: , Rfl: ;  oxycodone (ROXICODONE) 30 MG immediate release tablet, Take 1 tablet (30 mg total) by mouth every 4 (four) hours as needed for pain. Take 1 tabs q 4 hrs prn pain, Disp: 90 tablet, Rfl: 0 simvastatin (ZOCOR) 10 MG tablet, Take 10 mg by mouth at bedtime., Disp: , Rfl: ;  docusate sodium (COLACE) 100 MG capsule, Take 100 mg by mouth daily as needed for constipation., Disp: , Rfl: ;  senna (SENOKOT) 8.6 MG TABS tablet, Take 2 tablets (17.2 mg total) by mouth 2 (two) times daily., Disp: 120 each, Rfl: 0  REVIEW OF SYSTEMS:   Constitutional: Denies fevers, chills or abnormal weight loss Eyes: Denies blurriness of vision Respiratory: Denies cough, dyspnea or wheezes Cardiovascular: Denies palpitation, chest discomfort or lower extremity swelling Gastrointestinal:  Denies nausea, heartburn or change in bowel habits Skin: Denies abnormal skin rashes Lymphatics: Denies new lymphadenopathy or easy bruising Neurological:Denies numbness, tingling or new weaknesses Behavioral/Psych: Mood is stable, no new changes  All other systems were reviewed with the patient and are negative.  PHYSICAL EXAMINATION: ECOG PERFORMANCE STATUS:1  Filed Vitals:   04/21/13 1457  BP: 116/78  Pulse: 78  Temp: 98.3 F (36.8 C)  Resp: 18   Filed Weights   04/21/13 1457  Weight: 131 lb  4.8 oz (59.557 kg)    GENERAL:alert, no distress and comfortable. He looks thin and cachectic SKIN: skin color, texture, turgor are normal, persistent rashes around his neck is noted,  EYES: normal, Conjunctiva are pink and non-injected, sclera clear OROPHARYNX:no exudate, no erythema and lips, buccal mucosa, and tongue normal   NECK: supple, thyroid normal size, non-tender, without nodularity LYMPH:  no palpable lymphadenopathy in the cervical, axillary or inguinal LUNGS: clear to auscultation and percussion with normal breathing effort HEART: regular rate & rhythm and no murmurs and no lower extremity edema ABDOMEN:abdomen soft, non-tender and normal bowel sounds Musculoskeletal:no cyanosis of digits and no clubbing  NEURO: alert & oriented x 3 with fluent speech, no focal motor/sensory deficits  LABORATORY DATA:  I have reviewed the data as listed    Component Value Date/Time   NA 135* 04/07/2013 0855   NA 136 02/18/2013 1200   K 4.2 04/07/2013 0855   K 4.6 02/18/2013 1200   CL 100 02/18/2013 1200   CL 106 01/10/2013 1421   CO2 32* 04/07/2013 0855   CO2 27 02/18/2013 1200   GLUCOSE 111 04/07/2013 0855   GLUCOSE 97 02/18/2013 1200   GLUCOSE 113* 01/10/2013 1421   BUN 13.6 04/07/2013 0855   BUN 52* 02/18/2013 1200   CREATININE 0.9 04/07/2013 0855   CREATININE 1.00 02/18/2013 1200   CALCIUM 9.2 04/07/2013 0855   CALCIUM 9.4 02/18/2013 1200   PROT 6.4 03/14/2013 0757   ALBUMIN 3.2* 03/14/2013 0757   AST 17 03/14/2013 0757   ALT 13 03/14/2013 0757   ALKPHOS 69 03/14/2013 0757   BILITOT 0.25 03/14/2013 0757   GFRNONAA 78* 02/18/2013 1200   GFRAA >90 02/18/2013 1200    I No results found for this basename: SPEP, UPEP,  kappa and lambda light chains    Lab Results  Component Value Date   WBC 2.2* 04/07/2013   NEUTROABS 1.4* 04/07/2013   HGB 8.5* 04/07/2013   HCT 25.3* 04/07/2013   MCV 89.7 04/07/2013   PLT 190 04/07/2013      Chemistry      Component Value Date/Time   NA 135* 04/07/2013 0855   NA 136 02/18/2013 1200   K 4.2 04/07/2013 0855   K 4.6 02/18/2013 1200   CL 100 02/18/2013 1200   CL 106 01/10/2013 1421   CO2 32* 04/07/2013 0855   CO2 27 02/18/2013 1200   BUN 13.6 04/07/2013 0855   BUN 52* 02/18/2013 1200   CREATININE 0.9 04/07/2013 0855   CREATININE 1.00 02/18/2013 1200      Component Value Date/Time    CALCIUM 9.2 04/07/2013 0855   CALCIUM 9.4 02/18/2013 1200   ALKPHOS 69 03/14/2013 0757   AST 17 03/14/2013 0757   ALT 13 03/14/2013 0757   BILITOT 0.25 03/14/2013 0757     ASSESSMENT: Invasive tongue cancer s/p chemo&radiation therapy  PLAN:  #1 left tongue cancer He has completed chemotherapy and radiation therapy. We will continue aggressive supportive care #2 Severe mucositis This is expected from side effects of radiation treatment. We'll continue conservative management #3 Progressive weight loss I recommend you increase his oral intake and use his feeding tube. The patient declined the use of feeding tube for now  #4 Uncontrolled pain We discussed about medication refill. His pain is poorly controlled. I am increasing his MS Contin to TID.  #5 Severe constipation I recommend he continue Miralax and I am adding scheduled Sennokot. #6 Progressive decline Patient is adamant not to use his feeding  tube. I will continue weekly visit in an attempt to keep him out of the hospital.    All questions were answered. The patient knows to call the clinic with any problems, questions or concerns. We can certainly see the patient much sooner if necessary. No barriers to learning was detected.  [facility], Waverley Krempasky, MD 04/21/2013 9:47 PM

## 2013-04-23 ENCOUNTER — Telehealth: Payer: Self-pay | Admitting: *Deleted

## 2013-04-23 NOTE — Telephone Encounter (Signed)
Error in documentation

## 2013-04-23 NOTE — Telephone Encounter (Signed)
Called pt as f/u to s/p RT.  Spoke with pt spouse who indicated pt doing well.  Will continue to navigate as L3 (treatments completed) patient.  Young Berry, RN, BSN, [facility] Navigator [phone]

## 2013-04-23 NOTE — Progress Notes (Signed)
  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 The patient received 60 gray at 2 gray per fraction. This treatment was carried out using a IMRT technique on our tomotherapy unit.   Narrative: The patient tolerated radiation treatment without substantial delay in his course of treatment. Patient did have significant skin irritation as well as mucositis. Skin held up well at the end of treatment without significant moist desquamation.   Plan: The patient has completed radiation treatment. The patient will return to radiation oncology clinic for routine followup in one month. I advised the patient to call or return sooner if they have any questions or concerns related to their recovery or treatment. ________________________________  Radene Gunning, M.D., Ph.D.

## 2013-04-29 ENCOUNTER — Ambulatory Visit: Payer: Medicare Other | Admitting: Nutrition

## 2013-04-29 ENCOUNTER — Ambulatory Visit (HOSPITAL_BASED_OUTPATIENT_CLINIC_OR_DEPARTMENT_OTHER): Payer: Medicare Other | Admitting: Hematology and Oncology

## 2013-04-29 ENCOUNTER — Encounter: Payer: Self-pay | Admitting: *Deleted

## 2013-04-29 ENCOUNTER — Other Ambulatory Visit (HOSPITAL_BASED_OUTPATIENT_CLINIC_OR_DEPARTMENT_OTHER): Payer: Medicare Other | Admitting: Lab

## 2013-04-29 ENCOUNTER — Telehealth: Payer: Self-pay | Admitting: Hematology and Oncology

## 2013-04-29 ENCOUNTER — Encounter: Payer: Self-pay | Admitting: Hematology and Oncology

## 2013-04-29 VITALS — BP 119/75 | HR 81 | Temp 97.7°F | Resp 18 | Ht 68.0 in | Wt 123.7 lb

## 2013-04-29 DIAGNOSIS — C029 Malignant neoplasm of tongue, unspecified: Secondary | ICD-10-CM

## 2013-04-29 DIAGNOSIS — D649 Anemia, unspecified: Secondary | ICD-10-CM

## 2013-04-29 DIAGNOSIS — D72819 Decreased white blood cell count, unspecified: Secondary | ICD-10-CM

## 2013-04-29 DIAGNOSIS — R634 Abnormal weight loss: Secondary | ICD-10-CM | POA: Insufficient documentation

## 2013-04-29 HISTORY — DX: Anemia, unspecified: D64.9

## 2013-04-29 HISTORY — DX: Decreased white blood cell count, unspecified: D72.819

## 2013-04-29 LAB — COMPREHENSIVE METABOLIC PANEL (CC13)
Albumin: 3.4 g/dL — ABNORMAL LOW (ref 3.5–5.0)
Alkaline Phosphatase: 61 U/L (ref 40–150)
BUN: 15.8 mg/dL (ref 7.0–26.0)
CO2: 33 mEq/L — ABNORMAL HIGH (ref 22–29)
Calcium: 9.3 mg/dL (ref 8.4–10.4)
Chloride: 96 mEq/L — ABNORMAL LOW (ref 98–109)
Creatinine: 1.1 mg/dL (ref 0.7–1.3)
Glucose: 147 mg/dl — ABNORMAL HIGH (ref 70–140)
Potassium: 5 mEq/L (ref 3.5–5.1)
Total Protein: 6.4 g/dL (ref 6.4–8.3)

## 2013-04-29 LAB — CBC WITH DIFFERENTIAL/PLATELET
Basophils Absolute: 0 10*3/uL (ref 0.0–0.1)
EOS%: 2.8 % (ref 0.0–7.0)
Eosinophils Absolute: 0.1 10*3/uL (ref 0.0–0.5)
HCT: 22.8 % — ABNORMAL LOW (ref 38.4–49.9)
HGB: 7.9 g/dL — ABNORMAL LOW (ref 13.0–17.1)
MCH: 30.8 pg (ref 27.2–33.4)
MCHC: 34.6 g/dL (ref 32.0–36.0)
MONO#: 0.3 10*3/uL (ref 0.1–0.9)
NEUT#: 1.6 10*3/uL (ref 1.5–6.5)
NEUT%: 71.6 % (ref 39.0–75.0)
Platelets: 209 10*3/uL (ref 140–400)
WBC: 2.2 10*3/uL — ABNORMAL LOW (ref 4.0–10.3)
lymph#: 0.2 10*3/uL — ABNORMAL LOW (ref 0.9–3.3)

## 2013-04-29 LAB — MAGNESIUM (CC13): Magnesium: 2.1 mg/dl (ref 1.5–2.5)

## 2013-04-29 NOTE — Telephone Encounter (Signed)
Gave pt appt for lab and MD for october 15th

## 2013-04-29 NOTE — Progress Notes (Signed)
Patient states he is willing to do anything to heal and eat again.  He has tried to use his feeding tube.  Reports he feels very full after administering one can of Ensure.  He had several days where he could not eat or drink anything thick.  He states food tastes terrible and he had mouth sores.  He was able to eat oatmeal and soup today and drink one can of Ensure.  Weight was documented as 123.7 pounds October 7 down from 131.3 pounds September 29.  Labs were reviewed.  Noted sodium 136, potassium 5.0, glucose 147, and albumin 3.4.  Patient's wife continues to be supportive.  She is willing to help patient with tube feedings.  Nutrition diagnosis: Inadequate oral intake continues.  Estimated nutrition needs: 2063-2380 calories, 103-117 g protein, 2.3 L fluid.  Intervention: Patient will begin one half can ensure complete 4 times a day with 60 mL free water before and after each bolus feeding.  If tolerated, patient will increase to three fourths of a can of ensure complete on day 3 of feedings.  If patient continues to tolerate he will gradually increase to one can 4 times a day on day 7 of feedings.  He will continue to flush with 60 mL free water before and after boluses.  He is to administer tube feedings at room temperature and very slowly.  He will flush feeding tube with 240 mL free water 3 times a day between tube feedings.  He will work to continue increasing oral intake as tolerated.  If unable to consume oral intake patient will continue to increase tube feedings to goal rate of 6 cans ensure complete daily to provide approximately 2100 calories, and 78 g protein. Written instructions provided.  Coupons provided. Teach back method used.  Questions were answered.  Monitoring, evaluation, goals: Patient will tolerate bolus tube feeding along with oral intake to promote healing and weight gain.  Next visit: Patient's wife will contact me with questions or concerns.  They have my contact  information.

## 2013-04-29 NOTE — Progress Notes (Signed)
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 Pathology showed negative margins but 2 lymph nodes were positive, without extracapsular extension but with lymphovascular invasion. The patient was offered adjuvant treatment with concurrent chemoradiation therapy with cisplatin. His last dose of cisplatin was on 04/07/2013. Anticipate the last dose of radiation would be on 04/15/2013.  INTERVAL HISTORY: Brent [name] 62 y.o. male returns for further followup. He has completed all his treatment. Since the last time I saw him, he had lost 8 more pounds of weight. According to the patient, due to severe constipation recently, he accidentally took 3 packets of MiraLax and had profuse diarrhea. He attributed his weight loss due to that. His pain in his throat is much better controlled. Previously I increase his pain medicine to 3 times a day but is only taking it 2 times a day. He continued to take his Roxicodone 4-5 times a day. He is able to swallow better. He did start using his feeding tube last week. However with each can of nutritional feeding, he complained of some reflux and nausea due to gastric distention. He denies any recent fevers or chills. Denies any dizziness, lightheadedness, or recent bleeding.  I have reviewed the past medical history, past surgical history, social history and family history with the patient and they are unchanged from previous note.  ALLERGIES:  has No Known Allergies.  MEDICATIONS: Current outpatient prescriptions:ALPRAZolam (XANAX) 0.5 MG tablet, Take 0.5 mg by mouth daily as needed for anxiety., Disp: , Rfl: ;  Alum & Mag Hydroxide-Simeth (MAGIC  MOUTHWASH W/LIDOCAINE) SOLN, Take 15 mLs by mouth 4 (four) times daily as needed (discomfort)., Disp: 500 mL, Rfl: 5;  cyclobenzaprine (FLEXERIL) 10 MG tablet, Take 10 mg by mouth daily as needed for muscle spasms. , Disp: , Rfl:  docusate sodium (COLACE) 100 MG capsule, Take 100 mg by mouth daily as needed for constipation., Disp: , Rfl: ;  emollient (BIAFINE) cream, Apply 1 application topically 2 (two) times daily. Apply after rad tx and bedtime daily and on weekends as well, Disp: , Rfl: ;  ibuprofen (ADVIL,MOTRIN) 400 MG tablet, Take 1 tablet (400 mg total) by mouth every 6 (six) hours as needed., Disp: 30 tablet, Rfl: 0 morphine (MS CONTIN) 100 MG 12 hr tablet, Take 1 tablet (100 mg total) by mouth 3 (three) times daily., Disp: 90 tablet, Rfl: 0;  Multiple Vitamin (MULTIVITAMIN WITH MINERALS) TABS, Take 1 tablet by mouth daily., Disp: , Rfl: ;  oxycodone (ROXICODONE) 30 MG immediate release tablet, Take 1 tablet (30 mg total) by mouth every 4 (four) hours as needed for pain. Take 1 tabs q 4 hrs prn pain, Disp: 90 tablet, Rfl: 0 senna (SENOKOT) 8.6 MG TABS tablet, Take 2 tablets (17.2 mg total) by mouth 2 (two) times daily., Disp: 120 each, Rfl: 0;  simvastatin (ZOCOR) 10 MG tablet, Take 10 mg by mouth at bedtime., Disp: , Rfl: ;  citalopram (CELEXA) 20 MG tablet, , Disp: , Rfl: ;  lisinopril (PRINIVIL,ZESTRIL) 20 MG tablet, Take 20 mg by mouth every morning. , Disp: , Rfl:   REVIEW OF SYSTEMS:   Constitutional: Denies fevers, chills  Eyes: Denies blurriness of vision Ears, nose, mouth,  throat, and face: He has persistent mucositis and sore throat but they were much improved Respiratory: Denies cough, dyspnea or wheezes Cardiovascular: Denies palpitation, chest discomfort or lower extremity swelling Gastrointestinal:  Denies nausea, heartburn. Had recent diarrhea as above Skin: Denies abnormal skin rashes Lymphatics: Denies new lymphadenopathy or easy bruising Neurological:Denies numbness,  tingling or new weaknesses Behavioral/Psych: Mood is stable, no new changes  All other systems were reviewed with the patient and are negative.  PHYSICAL EXAMINATION: ECOG PERFORMANCE STATUS: 1 - Symptomatic but completely ambulatory  Filed Vitals:   04/29/13 1342  BP: 119/75  Pulse: 81  Temp: 97.7 F (36.5 C)  Resp: 18   Filed Weights   04/29/13 1342  Weight: 123 lb 11.2 oz (56.11 kg)    GENERAL:alert, no distress and comfortable he looks thin mildly cachectic and pale SKIN: skin color, texture, turgor are normal, no rashes or significant lesions radiation-induced skin injury around his neck is healing EYES: normal, Conjunctiva are pale and non-injected, sclera clear OROPHARYNX:no exudate, no erythema and lips, buccal mucosa, and tongue normal persistent mucositis grade 1 NECK: supple, thyroid normal size, non-tender, without nodularity LYMPH:  no palpable lymphadenopathy in the cervical, axillary or inguinal LUNGS: clear to auscultation and percussion with normal breathing effort HEART: regular rate & rhythm and no murmurs and no lower extremity edema ABDOMEN:abdomen soft, non-tender and normal bowel sounds feeding tube look okay Musculoskeletal:no cyanosis of digits and no clubbing  NEURO: alert & oriented x 3 with fluent speech, no focal motor/sensory deficits  LABORATORY DATA:  I have reviewed the data as listed    Component Value Date/Time   NA 136 04/29/2013 1312   NA 136 02/18/2013 1200   K 5.0 04/29/2013 1312   K 4.6 02/18/2013 1200   CL 100 02/18/2013 1200   CL 106 01/10/2013 1421   CO2 33* 04/29/2013 1312   CO2 27 02/18/2013 1200   GLUCOSE 147* 04/29/2013 1312   GLUCOSE 97 02/18/2013 1200   GLUCOSE 113* 01/10/2013 1421   BUN 15.8 04/29/2013 1312   BUN 52* 02/18/2013 1200   CREATININE 1.1 04/29/2013 1312   CREATININE 1.00 02/18/2013 1200   CALCIUM 9.3 04/29/2013 1312   CALCIUM 9.4 02/18/2013 1200   PROT 6.4 04/29/2013 1312   ALBUMIN 3.4* 04/29/2013 1312   AST 18  04/29/2013 1312   ALT 13 04/29/2013 1312   ALKPHOS 61 04/29/2013 1312   BILITOT 0.24 04/29/2013 1312   GFRNONAA 78* 02/18/2013 1200   GFRAA >90 02/18/2013 1200    No results found for this basename: SPEP, UPEP,  kappa and lambda light chains    Lab Results  Component Value Date   WBC 2.2* 04/29/2013   NEUTROABS 1.6 04/29/2013   HGB 7.9* 04/29/2013   HCT 22.8* 04/29/2013   MCV 89.1 04/29/2013   PLT 209 04/29/2013      Chemistry      Component Value Date/Time   NA 136 04/29/2013 1312   NA 136 02/18/2013 1200   K 5.0 04/29/2013 1312   K 4.6 02/18/2013 1200   CL 100 02/18/2013 1200   CL 106 01/10/2013 1421   CO2 33* 04/29/2013 1312   CO2 27 02/18/2013 1200   BUN 15.8 04/29/2013 1312   BUN 52* 02/18/2013 1200   CREATININE 1.1 04/29/2013 1312   CREATININE 1.00 02/18/2013 1200      Component Value Date/Time   CALCIUM 9.3 04/29/2013 1312   CALCIUM 9.4 02/18/2013 1200   ALKPHOS 61 04/29/2013 1312   AST  18 04/29/2013 1312   ALT 13 04/29/2013 1312   BILITOT 0.24 04/29/2013 1312      ASSESSMENT: Tongue cancer, status post chemoradiation therapy  PLAN:  #1 squamous cell carcinoma of the tongue His completed chemoradiation therapy. Examination is satisfactory with no evidence of recurrence. We'll continue supportive care. #2 malnutrition and weight loss The patient continued to lose weight. However his motivated to stop using his feeding tube. He will be undergoing evaluation with our dietitian today. I continue to encourage him to increase oral intake as tolerated #3 severe anemia and leukopenia He is asymptomatic from both. I recommend we defer his influenza vaccination and 2 his white count recovers. In terms of his anemia, the patient declined blood transfusion. I think is reasonable to hold until he becomes symptomatic or his hemoglobin dropped to 7 g range #4 mucositis This is improved. Currently he is taking morphine sulfate twice a day with oxycodone as needed for breakthrough pain #5  diarrhea This is related to recent laxative. We'll continue conservative management. Overall the patient remains critically ill. He declined blood transfusions or admissions for further management of his complication. I will attempt to see him in the clinic on a weekly basis. We will redraw his blood work next week and if his hemoglobin drops further I will recommend blood transfusion.  All questions were answered. The patient knows to call the clinic with any problems, questions or concerns. We can certainly see the patient much sooner if necessary. No barriers to learning was detected.   Brent Ferrufino, MD 04/29/2013 2:26 PM

## 2013-04-30 NOTE — Progress Notes (Signed)
Met with patient prior to and during scheduled f/u appt with Dr. Bertis Ruddy.  Pt recognized that he has lost weight recently but stated "I'm on my own schedule to get back to normal".  He stated that he was able to eat some oatmeal this morning and feels like he is back to making progress with oral intake.  He indicated that he did start using his PEG this week to instill Ensure.  I re-educated pt and his wife that pt must demonstrate he is maintaining weight with oral intake before PEG can be removed.  They indicated understanding.  Pt has appt with Nutritionist following appt with Bertis Ruddy.  Will continue to navigate as L3 (treatments completed) patient.  Young Berry, RN, BSN, [facility] Navigator [phone]

## 2013-05-02 ENCOUNTER — Ambulatory Visit: Payer: Medicare Other | Admitting: Nutrition

## 2013-05-02 NOTE — Progress Notes (Signed)
Spoke with patient's wife today on the telephone.  She reports patient is tolerating increase in tube feeding.  He is using 5 cans of ensure complete daily along with small amounts of oral intake.  He continues to flush feeding tube with water and drink water by mouth.  Patient's wife denies questions regarding tube feeding or further needs.  I've encouraged her to contact me with questions or concerns.

## 2013-05-07 ENCOUNTER — Other Ambulatory Visit (HOSPITAL_BASED_OUTPATIENT_CLINIC_OR_DEPARTMENT_OTHER): Payer: Medicare Other | Admitting: Lab

## 2013-05-07 ENCOUNTER — Encounter: Payer: Self-pay | Admitting: Hematology and Oncology

## 2013-05-07 ENCOUNTER — Ambulatory Visit (HOSPITAL_BASED_OUTPATIENT_CLINIC_OR_DEPARTMENT_OTHER): Payer: Medicare Other | Admitting: Hematology and Oncology

## 2013-05-07 ENCOUNTER — Telehealth: Payer: Self-pay | Admitting: Hematology and Oncology

## 2013-05-07 VITALS — BP 123/76 | HR 83 | Temp 97.9°F | Resp 20 | Ht 68.0 in | Wt 121.2 lb

## 2013-05-07 DIAGNOSIS — D72819 Decreased white blood cell count, unspecified: Secondary | ICD-10-CM

## 2013-05-07 DIAGNOSIS — C029 Malignant neoplasm of tongue, unspecified: Secondary | ICD-10-CM

## 2013-05-07 DIAGNOSIS — D649 Anemia, unspecified: Secondary | ICD-10-CM

## 2013-05-07 DIAGNOSIS — E46 Unspecified protein-calorie malnutrition: Secondary | ICD-10-CM

## 2013-05-07 DIAGNOSIS — R21 Rash and other nonspecific skin eruption: Secondary | ICD-10-CM

## 2013-05-07 DIAGNOSIS — R634 Abnormal weight loss: Secondary | ICD-10-CM

## 2013-05-07 LAB — CBC WITH DIFFERENTIAL/PLATELET
Basophils Absolute: 0 10*3/uL (ref 0.0–0.1)
EOS%: 2.9 % (ref 0.0–7.0)
HCT: 22.9 % — ABNORMAL LOW (ref 38.4–49.9)
LYMPH%: 9.1 % — ABNORMAL LOW (ref 14.0–49.0)
MCH: 31.4 pg (ref 27.2–33.4)
MCHC: 34.3 g/dL (ref 32.0–36.0)
MONO#: 0.2 10*3/uL (ref 0.1–0.9)
MONO%: 10.5 % (ref 0.0–14.0)
NEUT%: 76.3 % — ABNORMAL HIGH (ref 39.0–75.0)
RBC: 2.5 10*6/uL — ABNORMAL LOW (ref 4.20–5.82)
RDW: 17.9 % — ABNORMAL HIGH (ref 11.0–14.6)
WBC: 2.3 10*3/uL — ABNORMAL LOW (ref 4.0–10.3)
lymph#: 0.2 10*3/uL — ABNORMAL LOW (ref 0.9–3.3)

## 2013-05-07 LAB — COMPREHENSIVE METABOLIC PANEL (CC13)
Alkaline Phosphatase: 58 U/L (ref 40–150)
Anion Gap: 5 mEq/L (ref 3–11)
BUN: 20.4 mg/dL (ref 7.0–26.0)
CO2: 32 mEq/L — ABNORMAL HIGH (ref 22–29)
Calcium: 9 mg/dL (ref 8.4–10.4)
Chloride: 98 mEq/L (ref 98–109)
Glucose: 123 mg/dl (ref 70–140)
Sodium: 135 mEq/L — ABNORMAL LOW (ref 136–145)
Total Bilirubin: 0.27 mg/dL (ref 0.20–1.20)
Total Protein: 6.1 g/dL — ABNORMAL LOW (ref 6.4–8.3)

## 2013-05-07 LAB — HOLD TUBE, BLOOD BANK

## 2013-05-07 NOTE — Progress Notes (Signed)
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 male returns for further supportive care. When I saw him last week, he was losing a little weight. He thought it was related to his diarrhea, secondary to MiraLax. He has stopped taking the relaxants and still able to have a normal bowel movement. His throat pain is resolving. He is able to eat better and has gained more than 5 pounds of weight under a week. His only complaint is development of a rash at the back of his head and mid his left axillary region. It is not itchy. He denies any dizziness, lightheadedness, chest pain or shortness of breath. Denies any recent bleeding such as epistaxis, hematuria, or hematochezia. He denies any recent fever, chills, night sweats or abnormal weight loss  I have reviewed the past medical history, past surgical history, social history and family history with the patient and they are unchanged from previous note.  ALLERGIES:  has No Known Allergies.  MEDICATIONS:  Current Outpatient Prescriptions  Medication Sig Dispense Refill  . ALPRAZolam (XANAX) 0.5 MG tablet Take 0.5 mg by mouth daily as needed for anxiety.      . citalopram (CELEXA) 20 MG tablet       . cyclobenzaprine (FLEXERIL) 10 MG tablet Take 10 mg by mouth daily as needed for muscle spasms.       Marland Kitchen docusate sodium (COLACE) 100 MG capsule Take 100 mg by mouth  daily as needed for constipation.      Marland Kitchen emollient (BIAFINE) cream Apply 1 application topically 2 (two) times daily. Apply after rad tx and bedtime daily and on weekends as well      . ibuprofen (ADVIL,MOTRIN) 400 MG tablet Take 1 tablet (400 mg total) by mouth every 6 (six) hours as needed.  30 tablet  0  . lisinopril (PRINIVIL,ZESTRIL) 20 MG tablet Take 20 mg by mouth every morning.       Marland Kitchen morphine (MS CONTIN) 100 MG 12 hr tablet Take 1 tablet (100 mg total) by mouth 3 (three) times daily.  90 tablet  0  . Multiple Vitamin (MULTIVITAMIN WITH MINERALS) TABS Take 1 tablet by mouth daily.      Marland Kitchen oxycodone (ROXICODONE) 30 MG immediate release tablet Take 1 tablet (30 mg total) by mouth every 4 (four) hours as needed for pain. Take 1 tabs q 4 hrs prn pain  90 tablet  0  . senna (SENOKOT) 8.6 MG TABS tablet Take 2 tablets (17.2 mg total) by mouth 2 (two) times daily.  120 each  0  . simvastatin (ZOCOR) 10 MG tablet Take 10 mg by mouth at bedtime.       No current facility-administered medications for this visit.    REVIEW OF SYSTEMS:   Constitutional: Denies fevers, chills  Eyes: Denies blurriness of vision Respiratory: Denies cough, dyspnea or wheezes Cardiovascular: Denies palpitation, chest discomfort or lower extremity swelling Gastrointestinal:  Denies nausea, heartburn or change in bowel habits Lymphatics: Denies new lymphadenopathy  or easy bruising Neurological:Denies numbness, tingling or new weaknesses Behavioral/Psych: Mood is stable, no new changes  All other systems were reviewed with the patient and are negative.  PHYSICAL EXAMINATION: ECOG PERFORMANCE STATUS: 1 - Symptomatic but completely ambulatory  Filed Vitals:   05/07/13 1449  BP: 123/76  Pulse: 83  Temp: 97.9 F (36.6 C)  Resp: 20   Filed Weights   05/07/13 1449  Weight: 121 lb 3.2 oz (54.976 kg)    GENERAL:alert, no distress and comfortable. The patient looks pale, thin and mildly cachectic SKIN: He had  skin rash at the back of his neck and the left of his axillary region that looked papular in nature. It looks like some sort of dermatitis. It does not look like shingles. EYES: normal, Conjunctiva are pink and non-injected, sclera clear OROPHARYNX:no exudate, no erythema and lips, buccal mucosa, and tongue normal  NECK: supple, thyroid normal size, non-tender, without nodularity LYMPH:  no palpable lymphadenopathy in the cervical, axillary or inguinal LUNGS: clear to auscultation and percussion with normal breathing effort HEART: regular rate & rhythm and no murmurs and no lower extremity edema ABDOMEN:abdomen soft, non-tender and normal bowel sounds Musculoskeletal:no cyanosis of digits and no clubbing  NEURO: alert & oriented x 3 with fluent speech, no focal motor/sensory deficits  LABORATORY DATA:  I have reviewed the data as listed    Component Value Date/Time   NA 136 04/29/2013 1312   NA 136 02/18/2013 1200   K 5.0 04/29/2013 1312   K 4.6 02/18/2013 1200   CL 100 02/18/2013 1200   CL 106 01/10/2013 1421   CO2 33* 04/29/2013 1312   CO2 27 02/18/2013 1200   GLUCOSE 147* 04/29/2013 1312   GLUCOSE 97 02/18/2013 1200   GLUCOSE 113* 01/10/2013 1421   BUN 15.8 04/29/2013 1312   BUN 52* 02/18/2013 1200   CREATININE 1.1 04/29/2013 1312   CREATININE 1.00 02/18/2013 1200   CALCIUM 9.3 04/29/2013 1312   CALCIUM 9.4 02/18/2013 1200   PROT 6.4 04/29/2013 1312   ALBUMIN 3.4* 04/29/2013 1312   AST 18 04/29/2013 1312   ALT 13 04/29/2013 1312   ALKPHOS 61 04/29/2013 1312   BILITOT 0.24 04/29/2013 1312   GFRNONAA 78* 02/18/2013 1200   GFRAA >90 02/18/2013 1200    No results found for this basename: SPEP, UPEP,  kappa and lambda light chains    Lab Results  Component Value Date   WBC 2.3* 05/07/2013   NEUTROABS 1.7 05/07/2013   HGB 7.9* 05/07/2013   HCT 22.9* 05/07/2013   MCV 91.4 05/07/2013   PLT 178 05/07/2013      Chemistry      Component Value Date/Time   NA 136 04/29/2013 1312   NA 136  02/18/2013 1200   K 5.0 04/29/2013 1312   K 4.6 02/18/2013 1200   CL 100 02/18/2013 1200   CL 106 01/10/2013 1421   CO2 33* 04/29/2013 1312   CO2 27 02/18/2013 1200   BUN 15.8 04/29/2013 1312   BUN 52* 02/18/2013 1200   CREATININE 1.1 04/29/2013 1312   CREATININE 1.00 02/18/2013 1200      Component Value Date/Time   CALCIUM 9.3 04/29/2013 1312   CALCIUM 9.4 02/18/2013 1200   ALKPHOS 61 04/29/2013 1312   AST 18 04/29/2013 1312   ALT 13 04/29/2013 1312   BILITOT 0.24 04/29/2013 1312      ASSESSMENT:  Tongue cancer Rash Severe anemia and leukopenia  PLAN:   #1 tongue cancer We'll continue aggressive  supportive care. His next appointment will be next week. We will defer imaging study and to early next year #2 skin rash This does not resemble shingles. It looks like dermatitis. We will observe and treated conservatively with topical cream as needed #3 severe anemia This is related to recent treatment. The patient is asymptomatic. He does not see any signs of bleeding. I discussed with him the risk and benefit of blood transfusion and the patient declined #4 leukopenia The patient is asymptomatic. He is not neutropenic. We will observe closely for signs and symptoms of infection. #5 malnutrition The patient is gaining weight. I continue to encourage him to increase his oral intake. Orders Placed This Encounter  Procedures  . CBC with Differential    Standing Status: Future     Number of Occurrences:      Standing Expiration Date: 01/27/2014  . Hold Tube, Blood Bank    Standing Status: Future     Number of Occurrences:      Standing Expiration Date: 05/07/2014   All questions were answered. The patient knows to call the clinic with any problems, questions or concerns. No barriers to learning was detected. I spent 25 minutes counseling the patient face to face. The total time spent in the appointment was 40 minutes and more than 50% was on counseling and review of test results     [facility],  Brent Ozga, MD 05/07/2013 3:09 PM

## 2013-05-07 NOTE — Telephone Encounter (Signed)
gave pt appt for lab and MD on April 2015, gave pt oral contrast for CT °

## 2013-05-14 ENCOUNTER — Encounter: Payer: Self-pay | Admitting: *Deleted

## 2013-05-14 ENCOUNTER — Encounter: Payer: Self-pay | Admitting: Hematology and Oncology

## 2013-05-14 ENCOUNTER — Telehealth: Payer: Self-pay | Admitting: *Deleted

## 2013-05-14 ENCOUNTER — Other Ambulatory Visit (HOSPITAL_BASED_OUTPATIENT_CLINIC_OR_DEPARTMENT_OTHER): Payer: Medicare Other

## 2013-05-14 ENCOUNTER — Ambulatory Visit (HOSPITAL_BASED_OUTPATIENT_CLINIC_OR_DEPARTMENT_OTHER): Payer: Medicare Other | Admitting: Hematology and Oncology

## 2013-05-14 VITALS — BP 110/71 | HR 78 | Temp 97.1°F | Resp 20 | Ht 68.0 in | Wt 129.2 lb

## 2013-05-14 DIAGNOSIS — R21 Rash and other nonspecific skin eruption: Secondary | ICD-10-CM

## 2013-05-14 DIAGNOSIS — D649 Anemia, unspecified: Secondary | ICD-10-CM

## 2013-05-14 DIAGNOSIS — D72819 Decreased white blood cell count, unspecified: Secondary | ICD-10-CM

## 2013-05-14 DIAGNOSIS — C029 Malignant neoplasm of tongue, unspecified: Secondary | ICD-10-CM

## 2013-05-14 DIAGNOSIS — C01 Malignant neoplasm of base of tongue: Secondary | ICD-10-CM

## 2013-05-14 DIAGNOSIS — E46 Unspecified protein-calorie malnutrition: Secondary | ICD-10-CM

## 2013-05-14 LAB — CBC WITH DIFFERENTIAL/PLATELET
BASO%: 1.4 % (ref 0.0–2.0)
EOS%: 6.3 % (ref 0.0–7.0)
Eosinophils Absolute: 0.1 10*3/uL (ref 0.0–0.5)
LYMPH%: 15.2 % (ref 14.0–49.0)
MCH: 32.1 pg (ref 27.2–33.4)
MCHC: 34.7 g/dL (ref 32.0–36.0)
MCV: 92.5 fL (ref 79.3–98.0)
MONO%: 11.2 % (ref 0.0–14.0)
NEUT%: 65.9 % (ref 39.0–75.0)
Platelets: 134 10*3/uL — ABNORMAL LOW (ref 140–400)
RBC: 2.57 10*6/uL — ABNORMAL LOW (ref 4.20–5.82)
RDW: 18 % — ABNORMAL HIGH (ref 11.0–14.6)

## 2013-05-14 LAB — HOLD TUBE, BLOOD BANK

## 2013-05-14 NOTE — Progress Notes (Signed)
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 Pathology showed negative margins but 2 lymph nodes were positive, without extracapsular extension but with lymphovascular invasion. The patient was offered adjuvant treatment with concurrent chemoradiation therapy with cisplatin. His last dose of cisplatin was on 04/07/2013. Last dose of radiation would be on 04/15/2013.  INTERVAL HISTORY: KASS HERBERGER 62 y.o. male returns for further followup. He gained some weight since I saw him. Denies any constipation or diarrhea. He is able to swallow food 100% and gained 9 pounds since I saw him. His rash at the back of his head and the left axilla region but is resolving. No new areas of pain. The patient denies any recent signs or symptoms of bleeding such as spontaneous epistaxis, hematuria or hematochezia.  I have reviewed the past medical history, past surgical history, social history and family history with the patient and they are unchanged from previous note.  ALLERGIES:  has No Known Allergies.  MEDICATIONS:  Current Outpatient Prescriptions  Medication Sig Dispense Refill  . ALPRAZolam (XANAX) 0.5 MG tablet Take 0.5 mg by mouth daily as needed for anxiety.      . citalopram (CELEXA) 20 MG tablet       . cyclobenzaprine (FLEXERIL) 10 MG tablet Take 10 mg by mouth daily as needed for muscle spasms.       Marland Kitchen  docusate sodium (COLACE) 100 MG capsule Take 100 mg by mouth daily as needed for constipation.      Marland Kitchen morphine (MS CONTIN) 100 MG 12 hr tablet Take 1 tablet (100 mg total) by mouth 3 (three) times daily.  90 tablet  0  . Multiple Vitamin (MULTIVITAMIN WITH MINERALS) TABS Take 1 tablet by mouth daily.      Marland Kitchen oxycodone (ROXICODONE) 30 MG immediate release tablet Take 1 tablet (30 mg total) by mouth every 4 (four) hours as needed for pain. Take 1 tabs q 4 hrs prn pain  90 tablet  0  . senna (SENOKOT) 8.6 MG TABS tablet Take 2 tablets (17.2 mg total) by mouth 2 (two) times daily.  120 each  0  . simvastatin (ZOCOR) 10 MG tablet Take 10 mg by mouth at bedtime.      Marland Kitchen emollient (BIAFINE) cream Apply 1 application topically 2 (two) times daily. Apply after rad tx and bedtime daily and on weekends as well      . ibuprofen (ADVIL,MOTRIN) 400 MG tablet Take 1 tablet (400 mg total) by mouth every 6 (six) hours as needed.  30 tablet  0  . lisinopril (PRINIVIL,ZESTRIL) 20 MG tablet Take 20 mg by mouth every morning.        No current facility-administered medications for this visit.    REVIEW OF SYSTEMS:   Constitutional: Denies fevers, chills or abnormal weight loss Eyes: Denies blurriness of vision Ears, nose, mouth, throat, and face: Denies mucositis or sore throat Respiratory: Denies cough, dyspnea or wheezes Cardiovascular: Denies palpitation, chest discomfort or lower extremity  swelling Gastrointestinal:  Denies nausea, heartburn or change in bowel habits Skin: Denies abnormal skin rashes Lymphatics: Denies new lymphadenopathy or easy bruising Neurological:Denies numbness, tingling or new weaknesses Behavioral/Psych: Mood is stable, no new changes  All other systems were reviewed with the patient and are negative.  PHYSICAL EXAMINATION: ECOG PERFORMANCE STATUS: 0 - Asymptomatic  Filed Vitals:   05/14/13 1530  BP: 110/71  Pulse: 78  Temp: 97.1 F (36.2 C)  Resp: 20   Filed Weights    05/14/13 1530  Weight: 129 lb 3.2 oz (58.605 kg)    GENERAL:alert, no distress and comfortable. Patient looks thin and pale. SKIN: skin color, texture, turgor are normal, no rashes or significant lesions. Previously no other skin rashes resolving. EYES: normal, Conjunctiva are pink and non-injected, sclera clear OROPHARYNX:no exudate, no erythema and lips, buccal mucosa, and tongue normal . No areas of mucositis or thrush NECK: supple, thyroid normal size, non-tender, without nodularity ABDOMEN:abdomen soft, non-tender and normal bowel sounds. Feeding tube was looks okay. Musculoskeletal:no cyanosis of digits and no clubbing  NEURO: alert & oriented x 3 with fluent speech, no focal motor/sensory deficits  LABORATORY DATA:  I have reviewed the data as listed    Component Value Date/Time   NA 135* 05/07/2013 1420   NA 136 02/18/2013 1200   K 4.3 05/07/2013 1420   K 4.6 02/18/2013 1200   CL 100 02/18/2013 1200   CL 106 01/10/2013 1421   CO2 32* 05/07/2013 1420   CO2 27 02/18/2013 1200   GLUCOSE 123 05/07/2013 1420   GLUCOSE 97 02/18/2013 1200   GLUCOSE 113* 01/10/2013 1421   BUN 20.4 05/07/2013 1420   BUN 52* 02/18/2013 1200   CREATININE 0.9 05/07/2013 1420   CREATININE 1.00 02/18/2013 1200   CALCIUM 9.0 05/07/2013 1420   CALCIUM 9.4 02/18/2013 1200   PROT 6.1* 05/07/2013 1420   ALBUMIN 3.3* 05/07/2013 1420   AST 16 05/07/2013 1420   ALT 10 05/07/2013 1420   ALKPHOS 58 05/07/2013 1420   BILITOT 0.27 05/07/2013 1420   GFRNONAA 78* 02/18/2013 1200   GFRAA >90 02/18/2013 1200    No results found for this basename: SPEP,  UPEP,   kappa and lambda light chains    Lab Results  Component Value Date   WBC 2.0* 05/14/2013   NEUTROABS 1.3* 05/14/2013   HGB 8.2* 05/14/2013   HCT 23.8* 05/14/2013   MCV 92.5 05/14/2013   PLT 134* 05/14/2013      Chemistry      Component Value Date/Time   NA 135* 05/07/2013 1420   NA 136 02/18/2013 1200   K 4.3 05/07/2013 1420   K 4.6 02/18/2013 1200    CL 100 02/18/2013 1200   CL 106 01/10/2013 1421   CO2 32* 05/07/2013 1420   CO2 27 02/18/2013 1200   BUN 20.4 05/07/2013 1420   BUN 52* 02/18/2013 1200   CREATININE 0.9 05/07/2013 1420   CREATININE 1.00 02/18/2013 1200      Component Value Date/Time   CALCIUM 9.0 05/07/2013 1420   CALCIUM 9.4 02/18/2013 1200   ALKPHOS 58 05/07/2013 1420   AST 16 05/07/2013 1420   ALT 10 05/07/2013 1420   BILITOT 0.27 05/07/2013 1420     ASSESSMENT:  Tongue cancer status post chemoradiation therapy  PLAN:  #1 tongue cancer We'll continue aggressive supportive care. His next appointment will be in 2 weeks now that he is recovering. We will defer imaging study and to early next year #2 skin rash  This does not resemble shingles. It looks like dermatitis. We will observe and treated conservatively with topical cream as needed #3 severe anemia This is related to recent treatment. The patient is asymptomatic. He does not see any signs of bleeding. He does not require blood transfusion  #4 leukopenia The patient is asymptomatic. He is not neutropenic. We will observe closely for signs and symptoms of infection. #5 malnutrition The patient is gaining weight. I continue to encourage him to increase his oral intake. We will keep the feeding tube for now and not remove it until his white count resolved  Orders Placed This Encounter  Procedures  . CBC with Differential    Standing Status: Future     Number of Occurrences:      Standing Expiration Date: 02/03/2014  . Comprehensive metabolic panel    Standing Status: Future     Number of Occurrences:      Standing Expiration Date: 05/14/2014  . Hold Tube, Blood Bank    Only transfuse if hg>7    Standing Status: Future     Number of Occurrences:      Standing Expiration Date: 05/14/2014   All questions were answered. The patient knows to call the clinic with any problems, questions or concerns. No barriers to learning was detected. I spent 15 minutes  counseling the patient face to face. The total time spent in the appointment was 20 minutes and more than 50% was on counseling and review of test results     [facility], Alexismarie Flaim, MD 05/14/2013 3:48 PM

## 2013-05-14 NOTE — Progress Notes (Signed)
Met with patient and his wife prior to and during scheduled appt with Dr. Bertis Ruddy to provide support and encouragement.  Patient indicated he has not been using PEG, is beginning to get some return of taste, and is not experiencing mouth dryness.  Continuing to navigate as L3 patient (treatments completed).  Young Berry, RN, BSN, [facility] Oncology Navigator [phone]

## 2013-05-14 NOTE — Telephone Encounter (Signed)
appts made and printed...td 

## 2013-05-19 ENCOUNTER — Ambulatory Visit (HOSPITAL_COMMUNITY): Payer: Self-pay | Admitting: Dentistry

## 2013-05-21 ENCOUNTER — Ambulatory Visit (HOSPITAL_COMMUNITY): Payer: Self-pay | Admitting: Dentistry

## 2013-05-28 ENCOUNTER — Telehealth: Payer: Self-pay | Admitting: Hematology and Oncology

## 2013-05-28 ENCOUNTER — Ambulatory Visit (HOSPITAL_BASED_OUTPATIENT_CLINIC_OR_DEPARTMENT_OTHER): Payer: Medicare Other | Admitting: Hematology and Oncology

## 2013-05-28 ENCOUNTER — Other Ambulatory Visit (HOSPITAL_BASED_OUTPATIENT_CLINIC_OR_DEPARTMENT_OTHER): Payer: Medicare Other | Admitting: Lab

## 2013-05-28 VITALS — BP 133/76 | HR 81 | Temp 97.4°F | Resp 20 | Ht 68.0 in | Wt 132.1 lb

## 2013-05-28 DIAGNOSIS — C01 Malignant neoplasm of base of tongue: Secondary | ICD-10-CM

## 2013-05-28 DIAGNOSIS — C029 Malignant neoplasm of tongue, unspecified: Secondary | ICD-10-CM

## 2013-05-28 DIAGNOSIS — D649 Anemia, unspecified: Secondary | ICD-10-CM

## 2013-05-28 DIAGNOSIS — D72819 Decreased white blood cell count, unspecified: Secondary | ICD-10-CM

## 2013-05-28 LAB — CBC WITH DIFFERENTIAL/PLATELET
BASO%: 1.2 % (ref 0.0–2.0)
Basophils Absolute: 0 10*3/uL (ref 0.0–0.1)
EOS%: 8.9 % — ABNORMAL HIGH (ref 0.0–7.0)
Eosinophils Absolute: 0.3 10*3/uL (ref 0.0–0.5)
HCT: 23.5 % — ABNORMAL LOW (ref 38.4–49.9)
LYMPH%: 10.1 % — ABNORMAL LOW (ref 14.0–49.0)
MCH: 32.5 pg (ref 27.2–33.4)
MCHC: 34.2 g/dL (ref 32.0–36.0)
MCV: 95 fL (ref 79.3–98.0)
MONO#: 0.3 10*3/uL (ref 0.1–0.9)
MONO%: 9.5 % (ref 0.0–14.0)
NEUT%: 70.3 % (ref 39.0–75.0)
Platelets: 150 10*3/uL (ref 140–400)
RBC: 2.47 10*6/uL — ABNORMAL LOW (ref 4.20–5.82)
WBC: 3.5 10*3/uL — ABNORMAL LOW (ref 4.0–10.3)

## 2013-05-28 LAB — COMPREHENSIVE METABOLIC PANEL (CC13)
Alkaline Phosphatase: 69 U/L (ref 40–150)
Calcium: 9.1 mg/dL (ref 8.4–10.4)
Creatinine: 0.8 mg/dL (ref 0.7–1.3)
Glucose: 112 mg/dl (ref 70–140)
Sodium: 131 mEq/L — ABNORMAL LOW (ref 136–145)
Total Bilirubin: 0.27 mg/dL (ref 0.20–1.20)
Total Protein: 6.4 g/dL (ref 6.4–8.3)

## 2013-05-28 LAB — HOLD TUBE, BLOOD BANK

## 2013-05-28 NOTE — Telephone Encounter (Signed)
sw Tina in IR and sch feedg tube removal for 11/6 at 2pm pt given cal and avs [facility]

## 2013-05-28 NOTE — Progress Notes (Signed)
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 Pathology showed negative margins but 2 lymph nodes were positive, without extracapsular extension but with lymphovascular invasion. The patient was offered adjuvant treatment with concurrent chemoradiation therapy with cisplatin. His last dose of cisplatin was on 04/07/2013. Last dose of radiation would be on 04/15/2013.  INTERVAL HISTORY: Brent [name] 62 y.o. male returns for further followup. He gained some weight since I saw him. Denies any constipation or diarrhea. He is able to swallow food 100% and gained 3 pounds since I saw him. His rash at the back of his head and the left axilla region has resolved No new areas of pain. The patient is attempting to wean himself off pain medications The patient denies any recent signs or symptoms of bleeding such as spontaneous epistaxis, hematuria or hematochezia. He has no symptoms from the anemia such as dizziness, shortness of breath, leg cramps  His past medical history, social history and family history were unchanged compared to previous note  ALLERGIES:  has No Known Allergies.  MEDICATIONS:  Current Outpatient Prescriptions  Medication Sig Dispense Refill  . ALPRAZolam (XANAX) 0.5 MG tablet Take 0.5 mg by mouth daily as needed for anxiety.      . citalopram (CELEXA) 20 MG tablet       . cyclobenzaprine  (FLEXERIL) 10 MG tablet Take 10 mg by mouth daily as needed for muscle spasms.       Marland Kitchen docusate sodium (COLACE) 100 MG capsule Take 100 mg by mouth daily as needed for constipation.      Marland Kitchen ibuprofen (ADVIL,MOTRIN) 400 MG tablet Take 1 tablet (400 mg total) by mouth every 6 (six) hours as needed.  30 tablet  0  . morphine (MS CONTIN) 100 MG 12 hr tablet Take 1 tablet (100 mg total) by mouth 3 (three) times daily.  90 tablet  0  . Multiple Vitamin (MULTIVITAMIN WITH MINERALS) TABS Take 1 tablet by mouth daily.      Marland Kitchen oxycodone (ROXICODONE) 30 MG immediate release tablet Take 1 tablet (30 mg total) by mouth every 4 (four) hours as needed for pain. Take 1 tabs q 4 hrs prn pain  90 tablet  0  . simvastatin (ZOCOR) 10 MG tablet Take 10 mg by mouth at bedtime.      . lidocaine (XYLOCAINE) 2 % solution       . senna (SENOKOT) 8.6 MG TABS tablet Take 2 tablets (17.2 mg total) by mouth 2 (two) times daily.  120 each  0   No current facility-administered medications for this visit.    REVIEW OF SYSTEMS:   Constitutional: Denies fevers, chills or abnormal weight loss Eyes: Denies blurriness of vision Ears, nose, mouth, throat, and face: Denies mucositis or sore throat Respiratory: Denies cough, dyspnea or wheezes Cardiovascular: Denies palpitation, chest discomfort or lower extremity swelling Gastrointestinal:  Denies nausea, heartburn or change in bowel habits Skin: Denies abnormal skin rashes Lymphatics: Denies new lymphadenopathy or  easy bruising Neurological:Denies numbness, tingling or new weaknesses Behavioral/Psych: Mood is stable, no new changes  All other systems were reviewed with the patient and are negative.  PHYSICAL EXAMINATION: ECOG PERFORMANCE STATUS: 1 - Symptomatic but completely ambulatory  Filed Vitals:   05/28/13 1428  BP: 133/76  Pulse: 81  Temp: 97.4 F (36.3 C)  Resp: 20   Filed Weights   05/28/13 1428  Weight: 132 lb 1.6 oz (59.92 kg)    GENERAL:alert, no  distress and comfortable. The patient looks thin but not cachectic. He looks mildly pale SKIN: skin color, texture, turgor are normal, no rashes or significant lesions EYES: normal, Conjunctiva are pale and non-injected, sclera clear OROPHARYNX:no exudate, no erythema and lips, buccal mucosa, and tongue normal with postsurgical changes NECK: supple, thyroid normal size, non-tender, without nodularity LYMPH:  no palpable lymphadenopathy in the cervical, axillary or inguinal LUNGS: clear to auscultation and percussion with normal breathing effort HEART: regular rate & rhythm and no murmurs and no lower extremity edema ABDOMEN:abdomen soft, non-tender and normal bowel sounds Musculoskeletal:no cyanosis of digits and no clubbing  NEURO: alert & oriented x 3 with fluent speech, no focal motor/sensory deficits  LABORATORY DATA:  I have reviewed the data as listed    Component Value Date/Time   NA 135* 05/07/2013 1420   NA 136 02/18/2013 1200   K 4.3 05/07/2013 1420   K 4.6 02/18/2013 1200   CL 100 02/18/2013 1200   CL 106 01/10/2013 1421   CO2 32* 05/07/2013 1420   CO2 27 02/18/2013 1200   GLUCOSE 123 05/07/2013 1420   GLUCOSE 97 02/18/2013 1200   GLUCOSE 113* 01/10/2013 1421   BUN 20.4 05/07/2013 1420   BUN 52* 02/18/2013 1200   CREATININE 0.9 05/07/2013 1420   CREATININE 1.00 02/18/2013 1200   CALCIUM 9.0 05/07/2013 1420   CALCIUM 9.4 02/18/2013 1200   PROT 6.1* 05/07/2013 1420   ALBUMIN 3.3* 05/07/2013 1420   AST 16 05/07/2013 1420   ALT 10 05/07/2013 1420   ALKPHOS 58 05/07/2013 1420   BILITOT 0.27 05/07/2013 1420   GFRNONAA 78* 02/18/2013 1200   GFRAA >90 02/18/2013 1200    No results found for this basename: SPEP,  UPEP,   kappa and lambda light chains    Lab Results  Component Value Date   WBC 3.5* 05/28/2013   NEUTROABS 2.5 05/28/2013   HGB 8.0* 05/28/2013   HCT 23.5* 05/28/2013   MCV 95.0 05/28/2013   PLT 150 05/28/2013      Chemistry      Component Value Date/Time   NA  135* 05/07/2013 1420   NA 136 02/18/2013 1200   K 4.3 05/07/2013 1420   K 4.6 02/18/2013 1200   CL 100 02/18/2013 1200   CL 106 01/10/2013 1421   CO2 32* 05/07/2013 1420   CO2 27 02/18/2013 1200   BUN 20.4 05/07/2013 1420   BUN 52* 02/18/2013 1200   CREATININE 0.9 05/07/2013 1420   CREATININE 1.00 02/18/2013 1200      Component Value Date/Time   CALCIUM 9.0 05/07/2013 1420   CALCIUM 9.4 02/18/2013 1200   ALKPHOS 58 05/07/2013 1420   AST 16 05/07/2013 1420   ALT 10 05/07/2013 1420   BILITOT 0.27 05/07/2013 1420     ASSESSMENT:  Squamous cell carcinoma of the tongue, complete response to treatment  PLAN:  #1 tongue cancer We'll continue aggressive supportive care. His next appointment will be in one month from now that he is recovering. We  will defer imaging study and to early next year #2 skin rash, resolved #3 severe anemia This is related to recent treatment. The patient is asymptomatic. He does not see any signs of bleeding. He does not require blood transfusion  #4 leukopenia The patient is asymptomatic. He is not neutropenic. We will observe closely for signs and symptoms of infection. This is improved #5 malnutrition The patient is gaining weight. I continue to encourage him to increase his oral intake. We will remove his feeding tube and refer him back to interventional radiologists for this #6 chronic pain The patient is interested to wean himself off pain medications. He is receiving his pain prescription from the [place]. I encouraged the patient to do so. Orders Placed This Encounter  Procedures  . IR GASTROSTOMY TUBE REMOVAL    Standing Status: Future     Number of Occurrences:      Standing Expiration Date: 07/28/2014    Order Specific Question:  Reason for Exam (SYMPTOM  OR DIAGNOSIS REQUIRED)    Answer:  no need feeding tube    Order Specific Question:  Preferred Imaging Location?    Answer:  [facility]  . CBC with Differential    Standing Status: Future      Number of Occurrences:      Standing Expiration Date: 02/17/2014  . Comprehensive metabolic panel    Standing Status: Future     Number of Occurrences:      Standing Expiration Date: 05/28/2014   All questions were answered. The patient knows to call the clinic with any problems, questions or concerns. No barriers to learning was detected.   Damara Klunder, MD 05/28/2013 2:50 PM

## 2013-05-29 ENCOUNTER — Ambulatory Visit (HOSPITAL_COMMUNITY)
Admission: RE | Admit: 2013-05-29 | Discharge: 2013-05-29 | Disposition: A | Discharge status: Home / Self Care | Payer: Medicare Other | Source: Ambulatory Visit | Attending: Hematology and Oncology | Admitting: Hematology and Oncology

## 2013-05-29 DIAGNOSIS — Z431 Encounter for attention to gastrostomy: Secondary | ICD-10-CM | POA: Insufficient documentation

## 2013-05-29 DIAGNOSIS — C029 Malignant neoplasm of tongue, unspecified: Secondary | ICD-10-CM | POA: Insufficient documentation

## 2013-05-29 NOTE — Procedures (Signed)
Successful removal of 'pullthrough' gastrostomy tube. No complications. Instructions given.  Brayton El PA-C Interventional Radiology 05/29/2013 2:40 PM

## 2013-06-03 ENCOUNTER — Encounter (HOSPITAL_COMMUNITY): Payer: Self-pay | Admitting: Dentistry

## 2013-06-03 ENCOUNTER — Ambulatory Visit (HOSPITAL_COMMUNITY): Payer: Self-pay | Admitting: Dentistry

## 2013-06-03 VITALS — BP 133/88 | HR 73 | Temp 97.9°F | Wt 130.0 lb

## 2013-06-03 DIAGNOSIS — Z09 Encounter for follow-up examination after completed treatment for conditions other than malignant neoplasm: Secondary | ICD-10-CM

## 2013-06-03 DIAGNOSIS — Z923 Personal history of irradiation: Secondary | ICD-10-CM

## 2013-06-03 DIAGNOSIS — C029 Malignant neoplasm of tongue, unspecified: Secondary | ICD-10-CM

## 2013-06-03 DIAGNOSIS — Z9221 Personal history of antineoplastic chemotherapy: Secondary | ICD-10-CM

## 2013-06-03 DIAGNOSIS — C021 Malignant neoplasm of border of tongue: Secondary | ICD-10-CM

## 2013-06-03 DIAGNOSIS — K030 Excessive attrition of teeth: Secondary | ICD-10-CM

## 2013-06-03 DIAGNOSIS — Z0189 Encounter for other specified special examinations: Secondary | ICD-10-CM

## 2013-06-03 NOTE — Patient Instructions (Signed)
RECOMMENDATIONS: 1. Brush after meals and at bedtime.  Use fluoride at bedtime. 2. Use trismus exercises as directed. 3. Use Biotene Rinse or salt water/baking soda rinses. 4. Multiple sips of water as needed. 5. Return to primary dentist for exam and cleaning in 2-3 months. Call if problems before then.  Charlynne Pander, DDS  RADIATION THERAPY AND DECISIONS REGARDING YOUR TEETH  Xerostomia (dry mouth) Your salivary glands may be in the filed of radiation.  Radiation may include all or part of your saliva glands.  This will cause your saliva to dry up and you will have a dry mouth.  The dry mouth will be for the rest of your life unless your radiation oncologist tells you otherwise.  Your saliva has many functions:  Saliva wets your tongue for speaking.  It coats your teeth and the inside of your mouth for easier movement.  It helps with chewing and swallowing food.  It helps clean away harmful acid and toxic products made by the germs in your mouth, therefore it helps prevent cavities.  It kills some germs in your mouth and helps to prevent gum disease.  It helps to carry flavor to your taste buds.  Once you have lost your saliva you will be at higher risk for tooth decay and gum disease.  What can be done to help improve your mouth when there's not enough saliva:  1.  Your dentist may give a prescription for Salagen.  It will not bring back all of your saliva but may bring back some of it.  Also your saliva may be thick and ropy or white and foamy. It will not feel like it use to feel.  2.  You will need to swish with water every time your mouth feels dry.  YOU CANNOT suck on any cough drops, mints, lemon drops, candy, vitamin C or any other products.  You cannot use anything other than water to make your mouth feel less dry.  If you want to drink anything else you have to drink it all at once and brush afterwards.  Be sure to discuss the details of your diet habits with your  dentist or hygienist.  Radiation caries: This is decay that happens very quickly once your mouth is very dry due to radiation therapy.  Normally cavities take six months to two years to become a problem.  When you have dry mouth cavities may take as little as eight weeks to cause you a problem.  This is why dental check ups every two months are necessary as long as you have a dry mouth. Radiation caries typically, but not always, start at your gum line where it is hard to see the cavity.  It is therefore also hard to fill these cavities adequately.  This high rate of cavities happens because your mouth no longer has saliva and therefore the acid made by the germs starts the decay process.  Whenever you eat anything the germs in your mouth change the food into acid.  The acid then burns a small hole in your tooth.  This small hole is the beginning of a cavity.  If this is not treated then it will grow bigger and become a cavity.  The way to avoid this hole getting bigger is to use fluoride every evening as prescribed by your dentist.  You have to make sure that your teeth are very clean before you use the fluoride.  This fluoride in turn will strengthen your teeth  and prepare them for another day of fighting acid.  If you develop radiation caries many times the damage is so large that you will have to have all your teeth removed.  This could be a big problem if some of these teeth are in the field of radiation.  Further details of why this could be a big problem will follow.  (See Osteoradionecrosis).  Loss of taste (dysgeusia) This happens to varying degrees once you've had radiation therapy to your jaw region.  Many times taste is not completely lost but becomes limited.  The loss of taste is mostly due to radiation affecting your taste buds.  However if you have no saliva in your mouth to carry the flavor to your taste buds it would be difficult for your taste buds to taste anything.  That is why using  water or a prescription for Salagen prior to meals and during meals may help with some of the taste.  Keep in mind that taste generally returns very slowly over the course of several months or several years after radiation therapy.  Don't give up hope.  Trismus According to your Radiation Oncologist your TMJ or jaw joints are going to be partially or fully in the field of radiation.  This means that over time the muscles that help you open and close your mouth may get stiff.  This will potentially result in your not being able to open your mouth wide enough or as wide as you can open it now.  Le me give you an example of how slowly this happens and how unaware people are of it.  A gentlemen that had radiation therapy two years ago came back to me complaining that bananas are just too large for him to be able to fit them in between his teeth.  He was not able to open wide enough to bite into a banana.  This happens slowly and over a period of time.  What do we do to try and prevent this?  Your dentist will probably give you a stack of sticks called a trismus exercise device .  This stack will help your remind your muscles and your jaw joint to open up to the same distance every day.  Use these sticks every morning when you wake up according to the instructions given by the dentist.   You must use these sticks for at least one to two years after radiation therapy.  The reason for that is because it happens so slowly and keeps going on for about two years after radiation therapy.  Your hospital dentist will help you monitor your mouth opening and make sure that it's not getting smaller.  Osteoradionecrosis (ORN) This is a condition where your jaw bone after having had radiation therapy becomes very dry.  It has very little blood supply to keep it alive.  If you develop a cavity that turns into an abscess or an infection then the jaw bone does not have enough blood supply to help fight the infection.  At this  point it is very likely that the infection could cause the death of your jaw bone.  When you have dead bone it has to be removed.  Therefore you might end up having to have surgery to remove part of your jaw bone, the part of the jaw bone that has been affected.   Healing is also a problem if you are to have surgery in the areas where the bone has had radiation therapy.  The same reasons apply.  If you have surgery you need more blood supply which is not available.  When blood supply and oxygen are not available again, there is a chance for the bone to die.  Occasionally ORN happens on its own with no obvious reason.  This is quite rare.  We believe that patients who continue to smoke and/or drink alcohol have a higher chance of having this bone problem.  Therefore once your jaw bone has had radiation therapy if there are any teeth in that area, you should never have them pulled.  You should also never have any surgery on your teeth or gums in that area unless the oral surgeon or Periodontist is aware of your history of radiation. There is some expensive management techniques that might be used to limit your risks.  The risks for ORN either from infection or spontaneous ( or on it's own) are life long.

## 2013-06-03 NOTE — Progress Notes (Signed)
06/03/2013   Patient:            Brent [name] Date of Birth:  Jun 28, 1951 MRN:                [id number]  BP 133/88  Pulse 73  Temp(Src) 97.9 F (36.6 C) (Oral)  Wt 130 lb (58.968 kg)  [name] presents for periodic oral examination after radiation therapy. Patient has completed all treatments. The patient received chemoradiation therapy from 03/03/2013 through 04/15/2013.  REVIEW OF CHIEF COMPLAINTS:  DRY MOUTH: Yes HARD TO SWALLOW: No  HURT TO SWALLOW: No TASTE CHANGES: Taste is starting to return. SORES IN MOUTH: Patient denies having sores in his mouth. TRISMUS: Patient denies having any trismus symptoms. WEIGHT: 130 pounds  HOME OH REGIMEN:  BRUSHING: Twice a day FLOSSING: Once a day RINSING: Using Biotene rinses. FLUORIDE: Using fluoride at bedtime. TRISMUS EXERCISES:  Maximum interincisal opening: 40 mm   DENTAL EXAM:  Oral Hygiene:(PLAQUE): Good oral hygiene. Oral hygiene isntructions were provided. LOCATION OF MUCOSITIS: None noted DESCRIPTION OF SALIVA: Decreased saliva. Moderate xerostomia. ANY EXPOSED BONE: None noted OTHER WATCHED AREAS: Mandibular teeth that were in the primary field radiation therapy. Maxillary and mandibular interincisal attrition. Consider occlusal splint with primary dentist.  DX:  1. 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 Status post chemoradiation therapy 3. Xerostomia 4. Maxillary and mandibular interincisal attrition  RECOMMENDATIONS: 1. Brush after meals and at bedtime.  Use fluoride at bedtime. 2. Use trismus exercises as directed. 3. Use Biotene Rinse or salt water/baking soda rinses. 4. Multiple sips of water as needed. 5. Return to primary dentist -Dr. Clent Demark exam and cleaning in 2-3 months (January, 2015).  Charlynne Pander, DDS

## 2013-06-16 ENCOUNTER — Telehealth: Payer: Self-pay | Admitting: *Deleted

## 2013-06-16 NOTE — Telephone Encounter (Signed)
Called pt per routine navigation s/p treatment.  LVM asking for return call.  Young Berry, RN, BSN, [facility] Neck On[facility] Navigator [phone]

## 2013-06-27 ENCOUNTER — Encounter: Payer: Self-pay | Admitting: *Deleted

## 2013-06-27 ENCOUNTER — Ambulatory Visit (HOSPITAL_BASED_OUTPATIENT_CLINIC_OR_DEPARTMENT_OTHER): Payer: Medicare Other | Admitting: Hematology and Oncology

## 2013-06-27 ENCOUNTER — Encounter (INDEPENDENT_AMBULATORY_CARE_PROVIDER_SITE_OTHER): Payer: Self-pay

## 2013-06-27 ENCOUNTER — Telehealth: Payer: Self-pay | Admitting: Hematology and Oncology

## 2013-06-27 ENCOUNTER — Encounter: Payer: Self-pay | Admitting: Hematology and Oncology

## 2013-06-27 ENCOUNTER — Other Ambulatory Visit (HOSPITAL_BASED_OUTPATIENT_CLINIC_OR_DEPARTMENT_OTHER): Payer: Medicare Other | Admitting: Lab

## 2013-06-27 VITALS — BP 132/90 | HR 87 | Temp 98.3°F | Resp 19 | Ht 68.0 in | Wt 127.0 lb

## 2013-06-27 DIAGNOSIS — C029 Malignant neoplasm of tongue, unspecified: Secondary | ICD-10-CM

## 2013-06-27 DIAGNOSIS — D649 Anemia, unspecified: Secondary | ICD-10-CM

## 2013-06-27 DIAGNOSIS — E46 Unspecified protein-calorie malnutrition: Secondary | ICD-10-CM

## 2013-06-27 DIAGNOSIS — D72819 Decreased white blood cell count, unspecified: Secondary | ICD-10-CM

## 2013-06-27 DIAGNOSIS — R5383 Other fatigue: Secondary | ICD-10-CM

## 2013-06-27 DIAGNOSIS — G8929 Other chronic pain: Secondary | ICD-10-CM

## 2013-06-27 DIAGNOSIS — R630 Anorexia: Secondary | ICD-10-CM

## 2013-06-27 DIAGNOSIS — R634 Abnormal weight loss: Secondary | ICD-10-CM

## 2013-06-27 HISTORY — DX: Other fatigue: R53.83

## 2013-06-27 LAB — CBC WITH DIFFERENTIAL/PLATELET
Basophils Absolute: 0 10*3/uL (ref 0.0–0.1)
EOS%: 14.7 % — ABNORMAL HIGH (ref 0.0–7.0)
HCT: 30 % — ABNORMAL LOW (ref 38.4–49.9)
HGB: 10.4 g/dL — ABNORMAL LOW (ref 13.0–17.1)
LYMPH%: 14.5 % (ref 14.0–49.0)
MCH: 32.9 pg (ref 27.2–33.4)
MCHC: 34.5 g/dL (ref 32.0–36.0)
MONO#: 0.3 10*3/uL (ref 0.1–0.9)
MONO%: 10 % (ref 0.0–14.0)
NEUT%: 59.8 % (ref 39.0–75.0)
Platelets: 138 10*3/uL — ABNORMAL LOW (ref 140–400)
lymph#: 0.4 10*3/uL — ABNORMAL LOW (ref 0.9–3.3)

## 2013-06-27 LAB — COMPREHENSIVE METABOLIC PANEL (CC13)
ALT: 9 U/L (ref 0–55)
Anion Gap: 8 mEq/L (ref 3–11)
CO2: 34 mEq/L — ABNORMAL HIGH (ref 22–29)
Calcium: 9.8 mg/dL (ref 8.4–10.4)
Chloride: 98 mEq/L (ref 98–109)
Potassium: 4.4 mEq/L (ref 3.5–5.1)
Sodium: 139 mEq/L (ref 136–145)
Total Protein: 6.9 g/dL (ref 6.4–8.3)

## 2013-06-27 MED ORDER — MIRTAZAPINE 15 MG PO TABS: 15.0000 mg | ORAL_TABLET | Freq: Every day | ORAL | Status: DC

## 2013-06-27 NOTE — Telephone Encounter (Signed)
gv and printed appt sched and avs for pt for Feb  °

## 2013-06-27 NOTE — Progress Notes (Signed)
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 Pathology showed negative margins but 2 lymph nodes were positive, without extracapsular extension but with lymphovascular invasion. The patient was offered adjuvant treatment with concurrent chemoradiation therapy with cisplatin. His last dose of cisplatin was on 04/07/2013. Last dose of radiation would be on 04/15/2013.  INTERVAL HISTORY: Brent [name] 62 y.o. male returns for further followup. He lost 5 pounds since I saw him. This is due to anorexia and altered taste sensation. He denies any depression. Denies any swallowing difficulties. His pain is under control. The patient denies any recent signs or symptoms of bleeding such as spontaneous epistaxis, hematuria or hematochezia. He denies any recent fever, chills, night sweats or abnormal weight loss  I have reviewed the past medical history, past surgical history, social history and family history with the patient and they are unchanged from previous note.  ALLERGIES:  has No Known Allergies.  MEDICATIONS:  Current Outpatient Prescriptions  Medication Sig Dispense Refill  . ALPRAZolam (XANAX) 0.5 MG tablet Take 0.5 mg by mouth daily as needed for anxiety.      . citalopram (CELEXA) 20 MG tablet       . cyclobenzaprine (FLEXERIL) 10 MG tablet Take 10 mg by mouth daily as needed for  muscle spasms.       Marland Kitchen docusate sodium (COLACE) 100 MG capsule Take 100 mg by mouth daily as needed for constipation.      Marland Kitchen morphine (MS CONTIN) 100 MG 12 hr tablet Take 1 tablet (100 mg total) by mouth 3 (three) times daily.  90 tablet  0  . Multiple Vitamin (MULTIVITAMIN WITH MINERALS) TABS Take 1 tablet by mouth daily.      Marland Kitchen oxycodone (ROXICODONE) 30 MG immediate release tablet Take 1 tablet (30 mg total) by mouth every 4 (four) hours as needed for pain. Take 1 tabs q 4 hrs prn pain  90 tablet  0  . senna (SENOKOT) 8.6 MG TABS tablet Take 2 tablets (17.2 mg total) by mouth 2 (two) times daily.  120 each  0  . simvastatin (ZOCOR) 10 MG tablet Take 10 mg by mouth at bedtime.      . mirtazapine (REMERON) 15 MG tablet Take 1 tablet (15 mg total) by mouth at bedtime.  30 tablet  5   No current facility-administered medications for this visit.    REVIEW OF SYSTEMS:   Eyes: Denies blurriness of vision Ears, nose, mouth, throat, and face: Denies mucositis or sore throat Respiratory: Denies cough, dyspnea or wheezes Cardiovascular: Denies palpitation, chest discomfort or lower extremity swelling Gastrointestinal:  Denies nausea, heartburn or change in bowel habits Skin: Denies abnormal skin rashes Lymphatics: Denies new lymphadenopathy or easy bruising Neurological:Denies numbness, tingling or new weaknesses Behavioral/Psych: Mood is stable, no new changes  All other systems were reviewed with the patient and are negative.  PHYSICAL EXAMINATION: ECOG PERFORMANCE STATUS: 1 - Symptomatic but completely ambulatory  Filed Vitals:  06/27/13 1357  BP: 132/90  Pulse: 87  Temp: 98.3 F (36.8 C)  Resp: 19   Filed Weights   06/27/13 1357  Weight: 127 lb (57.607 kg)    GENERAL:alert, no distress and comfortable SKIN: skin color, texture, turgor are normal, no rashes or significant lesions EYES: normal, Conjunctiva are pink and non-injected, sclera clear OROPHARYNX:no exudate, no erythema  and lips, buccal mucosa, and tongue normal  NECK: supple, thyroid normal size, non-tender, without nodularity LYMPH:  no palpable lymphadenopathy in the cervical, axillary or inguinal LUNGS: clear to auscultation and percussion with normal breathing effort HEART: regular rate & rhythm and no murmurs and no lower extremity edema ABDOMEN:abdomen soft, non-tender and normal bowel sounds Musculoskeletal:no cyanosis of digits and no clubbing  NEURO: alert & oriented x 3 with fluent speech, no focal motor/sensory deficits  LABORATORY DATA:  I have reviewed the data as listed    Component Value Date/Time   NA 139 06/27/2013 1315   NA 136 02/18/2013 1200   K 4.4 06/27/2013 1315   K 4.6 02/18/2013 1200   CL 100 02/18/2013 1200   CL 106 01/10/2013 1421   CO2 34* 06/27/2013 1315   CO2 27 02/18/2013 1200   GLUCOSE 119 06/27/2013 1315   GLUCOSE 97 02/18/2013 1200   GLUCOSE 113* 01/10/2013 1421   BUN 14.8 06/27/2013 1315   BUN 52* 02/18/2013 1200   CREATININE 0.9 06/27/2013 1315   CREATININE 1.00 02/18/2013 1200   CALCIUM 9.8 06/27/2013 1315   CALCIUM 9.4 02/18/2013 1200   PROT 6.9 06/27/2013 1315   ALBUMIN 4.2 06/27/2013 1315   AST 17 06/27/2013 1315   ALT 9 06/27/2013 1315   ALKPHOS 62 06/27/2013 1315   BILITOT 0.27 06/27/2013 1315   GFRNONAA 78* 02/18/2013 1200   GFRAA >90 02/18/2013 1200    No results found for this basename: SPEP,  UPEP,   kappa and lambda light chains    Lab Results  Component Value Date   WBC 2.6* 06/27/2013   NEUTROABS 1.5 06/27/2013   HGB 10.4* 06/27/2013   HCT 30.0* 06/27/2013   MCV 95.2 06/27/2013   PLT 138* 06/27/2013      Chemistry      Component Value Date/Time   NA 139 06/27/2013 1315   NA 136 02/18/2013 1200   K 4.4 06/27/2013 1315   K 4.6 02/18/2013 1200   CL 100 02/18/2013 1200   CL 106 01/10/2013 1421   CO2 34* 06/27/2013 1315   CO2 27 02/18/2013 1200   BUN 14.8 06/27/2013 1315   BUN 52* 02/18/2013 1200   CREATININE 0.9 06/27/2013 1315   CREATININE 1.00 02/18/2013 1200       Component Value Date/Time   CALCIUM 9.8 06/27/2013 1315   CALCIUM 9.4 02/18/2013 1200   ALKPHOS 62 06/27/2013 1315   AST 17 06/27/2013 1315   ALT 9 06/27/2013 1315   BILITOT 0.27 06/27/2013 1315       ASSESSMENT & PLAN:  #1 tongue cancer We'll continue aggressive supportive care.  I am going to order PET/CT scan to be done next month and see him back in 2 months to review test results. #2 anorexia I recommended a trial of medicine to treat that. I gave him prescription of Remeron #3 anemia This is related to recent treatment. The patient is asymptomatic. He does not see any signs of bleeding. He does not require blood transfusion. Ite has improved compared to before #4 leukopenia The patient is asymptomatic. He is not neutropenic.  We will observe closely for signs and symptoms of infection. This is improved #5 malnutrition and weight loss I recommend he increase oral intake. I have placed a prescription Remeron for him to try.  #6 chronic pain The patient is interested to wean himself off pain medications. He is receiving his pain prescription from the [place]. I encouraged the patient to do so.  Orders Placed This Encounter  Procedures  . NM PET Image Restag (PS) Skull Base To Thigh    Standing Status: Future     Number of Occurrences:      Standing Expiration Date: 08/27/2014    Order Specific Question:  Reason for Exam (SYMPTOM  OR DIAGNOSIS REQUIRED)    Answer:  tongue cancer, assess response to Rx    Order Specific Question:  Preferred imaging location?    Answer:  [facility]  . T4, free    Standing Status: Future     Number of Occurrences:      Standing Expiration Date: 06/27/2014  . TSH    Standing Status: Future     Number of Occurrences:      Standing Expiration Date: 06/27/2014  . Comprehensive metabolic panel    Standing Status: Future     Number of Occurrences:      Standing Expiration Date: 06/27/2014  . CBC with Differential    Standing Status: Future      Number of Occurrences:      Standing Expiration Date: 03/19/2014   All questions were answered. The patient knows to call the clinic with any problems, questions or concerns. No barriers to learning was detected. I spent 25 minutes counseling the patient face to face. The total time spent in the appointment was 40 minutes and more than 50% was on counseling and review of test results     [facility], Derick Seminara, MD 06/27/2013 2:32 PM

## 2013-06-27 NOTE — Progress Notes (Signed)
Met with patient and his wife during routine follow-up appt with Dr. Bertis Ruddy.  He expressed no questions/concerns to me.  I reminded them of my continuing availability as his navigator.  They indicated understanding.  Young Berry, RN, BSN, [facility] Neck Oncology Navigator [phone]

## 2013-08-20 ENCOUNTER — Telehealth: Payer: Self-pay | Admitting: Hematology and Oncology

## 2013-08-20 NOTE — Telephone Encounter (Signed)
pt called to r/s appt...done °

## 2013-08-22 ENCOUNTER — Encounter (HOSPITAL_COMMUNITY): Payer: Self-pay

## 2013-08-22 ENCOUNTER — Encounter (HOSPITAL_COMMUNITY)
Admission: RE | Admit: 2013-08-22 | Discharge: 2013-08-22 | Disposition: A | Discharge status: Home / Self Care | Payer: Medicare Other | Source: Ambulatory Visit | Attending: Hematology and Oncology | Admitting: Hematology and Oncology

## 2013-08-22 ENCOUNTER — Encounter: Payer: Self-pay | Admitting: *Deleted

## 2013-08-22 DIAGNOSIS — C029 Malignant neoplasm of tongue, unspecified: Secondary | ICD-10-CM | POA: Insufficient documentation

## 2013-08-22 LAB — GLUCOSE, CAPILLARY: GLUCOSE-CAPILLARY: 103 mg/dL — AB (ref 70–99)

## 2013-08-22 MED ORDER — FLUDEOXYGLUCOSE F - 18 (FDG) INJECTION
15.3000 | Freq: Once | INTRAVENOUS | Status: AC | PRN
  Administered 2013-08-22: 15.3 via INTRAVENOUS

## 2013-08-22 NOTE — Progress Notes (Signed)
Spoke with patient after his PET.  He stated that his oral intake is improving but is far from his baseline.  Otherwise, he expressed no concerns.  Continuing to navigate as L3 (treatments completed) patient.  Gayleen Orem, RN, BSN, [facility] Head & Neck Oncology Navigator [phone]

## 2013-08-29 ENCOUNTER — Ambulatory Visit: Payer: Self-pay | Admitting: Hematology and Oncology

## 2013-08-29 ENCOUNTER — Other Ambulatory Visit: Payer: Self-pay

## 2013-09-02 ENCOUNTER — Encounter (INDEPENDENT_AMBULATORY_CARE_PROVIDER_SITE_OTHER): Payer: Self-pay

## 2013-09-02 ENCOUNTER — Ambulatory Visit (HOSPITAL_BASED_OUTPATIENT_CLINIC_OR_DEPARTMENT_OTHER): Payer: Medicare Other | Admitting: Hematology and Oncology

## 2013-09-02 ENCOUNTER — Encounter: Payer: Self-pay | Admitting: Hematology and Oncology

## 2013-09-02 ENCOUNTER — Encounter: Payer: Self-pay | Admitting: *Deleted

## 2013-09-02 ENCOUNTER — Other Ambulatory Visit (HOSPITAL_BASED_OUTPATIENT_CLINIC_OR_DEPARTMENT_OTHER): Payer: Medicare Other

## 2013-09-02 VITALS — BP 150/94 | HR 81 | Temp 97.3°F | Resp 18 | Ht 68.0 in | Wt 137.4 lb

## 2013-09-02 DIAGNOSIS — D6481 Anemia due to antineoplastic chemotherapy: Secondary | ICD-10-CM

## 2013-09-02 DIAGNOSIS — D649 Anemia, unspecified: Secondary | ICD-10-CM

## 2013-09-02 DIAGNOSIS — D72819 Decreased white blood cell count, unspecified: Secondary | ICD-10-CM

## 2013-09-02 DIAGNOSIS — T451X5A Adverse effect of antineoplastic and immunosuppressive drugs, initial encounter: Secondary | ICD-10-CM

## 2013-09-02 DIAGNOSIS — E039 Hypothyroidism, unspecified: Secondary | ICD-10-CM | POA: Insufficient documentation

## 2013-09-02 DIAGNOSIS — C029 Malignant neoplasm of tongue, unspecified: Secondary | ICD-10-CM

## 2013-09-02 DIAGNOSIS — R5381 Other malaise: Secondary | ICD-10-CM

## 2013-09-02 DIAGNOSIS — R5383 Other fatigue: Secondary | ICD-10-CM

## 2013-09-02 HISTORY — DX: Hypothyroidism, unspecified: E03.9

## 2013-09-02 LAB — CBC WITH DIFFERENTIAL/PLATELET
BASO%: 0.7 % (ref 0.0–2.0)
BASOS ABS: 0 10*3/uL (ref 0.0–0.1)
EOS%: 11.6 % — AB (ref 0.0–7.0)
Eosinophils Absolute: 0.3 10*3/uL (ref 0.0–0.5)
HEMATOCRIT: 31.2 % — AB (ref 38.4–49.9)
HGB: 10.6 g/dL — ABNORMAL LOW (ref 13.0–17.1)
LYMPH%: 13.7 % — AB (ref 14.0–49.0)
MCH: 31.1 pg (ref 27.2–33.4)
MCHC: 34 g/dL (ref 32.0–36.0)
MCV: 91.5 fL (ref 79.3–98.0)
MONO#: 0.3 10*3/uL (ref 0.1–0.9)
MONO%: 9.2 % (ref 0.0–14.0)
NEUT#: 1.9 10*3/uL (ref 1.5–6.5)
NEUT%: 64.8 % (ref 39.0–75.0)
Platelets: 152 10*3/uL (ref 140–400)
RBC: 3.41 10*6/uL — ABNORMAL LOW (ref 4.20–5.82)
RDW: 12.8 % (ref 11.0–14.6)
WBC: 2.9 10*3/uL — ABNORMAL LOW (ref 4.0–10.3)
lymph#: 0.4 10*3/uL — ABNORMAL LOW (ref 0.9–3.3)

## 2013-09-02 LAB — TSH CHCC: TSH: 2.276 m(IU)/L (ref 0.320–4.118)

## 2013-09-02 LAB — COMPREHENSIVE METABOLIC PANEL (CC13)
ALT: 18 U/L (ref 0–55)
AST: 25 U/L (ref 5–34)
Albumin: 4.4 g/dL (ref 3.5–5.0)
Alkaline Phosphatase: 62 U/L (ref 40–150)
Anion Gap: 7 mEq/L (ref 3–11)
BUN: 18.4 mg/dL (ref 7.0–26.0)
CALCIUM: 9.8 mg/dL (ref 8.4–10.4)
CHLORIDE: 99 meq/L (ref 98–109)
CO2: 33 mEq/L — ABNORMAL HIGH (ref 22–29)
Creatinine: 1 mg/dL (ref 0.7–1.3)
Glucose: 131 mg/dl (ref 70–140)
Potassium: 4.1 mEq/L (ref 3.5–5.1)
Sodium: 139 mEq/L (ref 136–145)
Total Bilirubin: 0.52 mg/dL (ref 0.20–1.20)
Total Protein: 7 g/dL (ref 6.4–8.3)

## 2013-09-02 LAB — T4, FREE: Free T4: 1.1 ng/dL (ref 0.80–1.80)

## 2013-09-02 NOTE — Progress Notes (Addendum)
Met with patient and his wife for s/p tmt appt with Dr. Alvy Bimler to provide support and care continuity.  Pt stated he is seeing Dr. Erik Obey, ENT, monthly.  Patient did not express any needs. Will see Dr. Alvy Bimler in 6 months, preceded by CT and blood work.  Continuing to navigate as L3 (treatments completed) patient.  Gayleen Orem, RN, BSN, [facility] Head & Neck Oncology Navigator [phone]

## 2013-09-02 NOTE — Progress Notes (Signed)
[place] OFFICE PROGRESS NOTE  Patient Care Team: Colen Darling as PCP - General (Unknown Physician Specialty) Jodi Marble, MD as Attending Physician (Otolaryngology) Marye Round, MD (Radiation Oncology) Brooks Sailors, RN as Registered Nurse (Oncology) Karie Mainland, RD as Dietitian (Nutrition)  DIAGNOSIS: Squamous cell carcinoma of the tongue status post chemotherapy and radiation therapy, no evidence of disease  SUMMARY OF ONCOLOGIC HISTORY: This patient was diagnosed with invasive tongue cancer. On 01/23/2013 he underwent resection of the tumor along with left neck dissection. Pathology showed negative margins but 2 lymph nodes were positive, without extracapsular extension but with lymphovascular invasion. The patient was offered adjuvant treatment with concurrent chemoradiation therapy with cisplatin. His last dose of cisplatin was on 04/07/2013. Anticipate the last dose of radiation would be on 9/23  INTERVAL HISTORY: RODEL GLASPY 63 y.o. male returns for further followup. He is doing well. His able to swallow food 100% by mouth. He has gained some weight since I saw him. He still has persistent dry mouth. He has discontinued his pain medications completely. He has better energy level.  I have reviewed the past medical history, past surgical history, social history and family history with the patient and they are unchanged from previous note.  ALLERGIES:  has No Known Allergies.  MEDICATIONS:  Current Outpatient Prescriptions  Medication Sig Dispense Refill  . cyclobenzaprine (FLEXERIL) 10 MG tablet Take 10 mg by mouth daily as needed for muscle spasms.       . Multiple Vitamin (MULTIVITAMIN WITH MINERALS) TABS Take 1 tablet by mouth daily.      Marland Kitchen oxycodone (ROXICODONE) 30 MG immediate release tablet Take 1 tablet (30 mg total) by mouth every 4 (four) hours as needed for pain. Take 1 tabs q 4 hrs prn pain  90 tablet  0  . simvastatin (ZOCOR)  10 MG tablet Take 10 mg by mouth at bedtime.       No current facility-administered medications for this visit.    REVIEW OF SYSTEMS:   Constitutional: Denies fevers, chills or abnormal weight loss Eyes: Denies blurriness of vision Ears, nose, mouth, throat, and face: Denies mucositis or sore throat Respiratory: Denies cough, dyspnea or wheezes Cardiovascular: Denies palpitation, chest discomfort or lower extremity swelling Gastrointestinal:  Denies nausea, heartburn or change in bowel habits Skin: Denies abnormal skin rashes Lymphatics: Denies new lymphadenopathy or easy bruising Neurological:Denies numbness, tingling or new weaknesses Behavioral/Psych: Mood is stable, no new changes  All other systems were reviewed with the patient and are negative.  PHYSICAL EXAMINATION: ECOG PERFORMANCE STATUS: 0 - Asymptomatic  Filed Vitals:   09/02/13 1210  BP: 150/94  Pulse: 81  Temp: 97.3 F (36.3 C)  Resp: 18   Filed Weights   09/02/13 1210  Weight: 137 lb 6.4 oz (62.324 kg)    GENERAL:alert, no distress and comfortable SKIN: skin color, texture, turgor are normal, no rashes or significant lesions EYES: normal, Conjunctiva are pink and non-injected, sclera clear OROPHARYNX:no exudate, no erythema and lips, buccal mucosa, and tongue normal . No thrush NECK: supple, thyroid normal size, non-tender, without nodularity LYMPH:  no palpable lymphadenopathy in the cervical, axillary or inguinal LUNGS: clear to auscultation and percussion with normal breathing effort HEART: regular rate & rhythm and no murmurs and no lower extremity edema ABDOMEN:abdomen soft, non-tender and normal bowel sounds Musculoskeletal:no cyanosis of digits and no clubbing  NEURO: alert & oriented x 3 with fluent speech, no focal motor/sensory deficits  LABORATORY DATA:  I have reviewed the data as listed    Component Value Date/Time   NA 139 09/02/2013 1155   NA 136 02/18/2013 1200   K 4.1 09/02/2013 1155    K 4.6 02/18/2013 1200   CL 100 02/18/2013 1200   CL 106 01/10/2013 1421   CO2 33* 09/02/2013 1155   CO2 27 02/18/2013 1200   GLUCOSE 131 09/02/2013 1155   GLUCOSE 97 02/18/2013 1200   GLUCOSE 113* 01/10/2013 1421   BUN 18.4 09/02/2013 1155   BUN 52* 02/18/2013 1200   CREATININE 1.0 09/02/2013 1155   CREATININE 1.00 02/18/2013 1200   CALCIUM 9.8 09/02/2013 1155   CALCIUM 9.4 02/18/2013 1200   PROT 7.0 09/02/2013 1155   ALBUMIN 4.4 09/02/2013 1155   AST 25 09/02/2013 1155   ALT 18 09/02/2013 1155   ALKPHOS 62 09/02/2013 1155   BILITOT 0.52 09/02/2013 1155   GFRNONAA 78* 02/18/2013 1200   GFRAA >90 02/18/2013 1200    No results found for this basename: SPEP,  UPEP,   kappa and lambda light chains    Lab Results  Component Value Date   WBC 2.9* 09/02/2013   NEUTROABS 1.9 09/02/2013   HGB 10.6* 09/02/2013   HCT 31.2* 09/02/2013   MCV 91.5 09/02/2013   PLT 152 09/02/2013      Chemistry      Component Value Date/Time   NA 139 09/02/2013 1155   NA 136 02/18/2013 1200   K 4.1 09/02/2013 1155   K 4.6 02/18/2013 1200   CL 100 02/18/2013 1200   CL 106 01/10/2013 1421   CO2 33* 09/02/2013 1155   CO2 27 02/18/2013 1200   BUN 18.4 09/02/2013 1155   BUN 52* 02/18/2013 1200   CREATININE 1.0 09/02/2013 1155   CREATININE 1.00 02/18/2013 1200      Component Value Date/Time   CALCIUM 9.8 09/02/2013 1155   CALCIUM 9.4 02/18/2013 1200   ALKPHOS 62 09/02/2013 1155   AST 25 09/02/2013 1155   ALT 18 09/02/2013 1155   BILITOT 0.52 09/02/2013 1155       RADIOGRAPHIC STUDIES: I reviewed the PET/CT scan which show complete resolution of prior disease I have personally reviewed the radiological images as listed and agreed with the findings in the report.  ASSESSMENT & PLAN:  #1 tongue cancer The patient is cancer free. I recommend history, physical examination, blood work and CT scan in 6 months. He will continue followup at ENT clinic #2 anemia This is related to recent treatment. The patient is asymptomatic. He does  not see any signs of bleeding. He does not require blood transfusion. It has improved compared to before #3 leukopenia The patient is asymptomatic. He is not neutropenic. We will observe closely for signs and symptoms of infection. This is improved #4 oral care The patient had poor dentition. Recommend followup with the dentist on a regular basis  Orders Placed This Encounter  Procedures  . CT Chest W Contrast    Standing Status: Future     Number of Occurrences:      Standing Expiration Date: 11/02/2014    Order Specific Question:  Reason for Exam (SYMPTOM  OR DIAGNOSIS REQUIRED)    Answer:  staging tongue ca, r/o recurrence    Order Specific Question:  Preferred imaging location?    Answer:  [facility]  . CT Soft Tissue Neck W Contrast    Standing Status: Future     Number of Occurrences:      Standing  Expiration Date: 12/03/2014    Order Specific Question:  Reason for Exam (SYMPTOM  OR DIAGNOSIS REQUIRED)    Answer:  staging tongue ca, r/o recurrence    Order Specific Question:  Preferred imaging location?    Answer:  [facility]  . Comprehensive metabolic panel    Standing Status: Future     Number of Occurrences:      Standing Expiration Date: 09/02/2014  . CBC with Differential    Standing Status: Future     Number of Occurrences:      Standing Expiration Date: 09/02/2014  . T4, free    Standing Status: Future     Number of Occurrences:      Standing Expiration Date: 09/02/2014  . TSH    Standing Status: Future     Number of Occurrences:      Standing Expiration Date: 09/02/2014   All questions were answered. The patient knows to call the clinic with any problems, questions or concerns. No barriers to learning was detected. I spent 25 minutes counseling the patient face to face. The total time spent in the appointment was 40 minutes and more than 50% was on counseling and review of test results     [facility], [place], MD 09/02/2013 3:38 PM

## 2013-09-03 ENCOUNTER — Telehealth: Payer: Self-pay | Admitting: Hematology and Oncology

## 2013-09-03 NOTE — Telephone Encounter (Signed)
s.w. pt and advised on Aug appt....pt ok and aware °

## 2014-01-01 ENCOUNTER — Encounter: Payer: Self-pay | Admitting: Nurse Practitioner

## 2014-01-05 LAB — IFOBT (OCCULT BLOOD): IFOBT: NEGATIVE

## 2014-01-08 ENCOUNTER — Ambulatory Visit: Payer: Self-pay | Admitting: Nurse Practitioner

## 2014-01-13 ENCOUNTER — Encounter: Payer: Self-pay | Admitting: Nurse Practitioner

## 2014-01-13 ENCOUNTER — Ambulatory Visit (INDEPENDENT_AMBULATORY_CARE_PROVIDER_SITE_OTHER): Payer: Medicare Other | Admitting: Nurse Practitioner

## 2014-01-13 ENCOUNTER — Other Ambulatory Visit (INDEPENDENT_AMBULATORY_CARE_PROVIDER_SITE_OTHER): Payer: Medicare Other

## 2014-01-13 VITALS — BP 126/72 | HR 84 | Ht 67.0 in | Wt 132.0 lb

## 2014-01-13 DIAGNOSIS — D649 Anemia, unspecified: Secondary | ICD-10-CM

## 2014-01-13 DIAGNOSIS — K5900 Constipation, unspecified: Secondary | ICD-10-CM

## 2014-01-13 LAB — CBC WITH DIFFERENTIAL/PLATELET
BASOS ABS: 0 10*3/uL (ref 0.0–0.1)
Basophils Relative: 0.9 % (ref 0.0–3.0)
EOS ABS: 0.3 10*3/uL (ref 0.0–0.7)
Eosinophils Relative: 6.7 % — ABNORMAL HIGH (ref 0.0–5.0)
HCT: 30.7 % — ABNORMAL LOW (ref 39.0–52.0)
Hemoglobin: 10.2 g/dL — ABNORMAL LOW (ref 13.0–17.0)
Lymphocytes Relative: 12.2 % (ref 12.0–46.0)
Lymphs Abs: 0.5 10*3/uL — ABNORMAL LOW (ref 0.7–4.0)
MCHC: 33.3 g/dL (ref 30.0–36.0)
MCV: 96.8 fl (ref 78.0–100.0)
MONO ABS: 0.3 10*3/uL (ref 0.1–1.0)
Monocytes Relative: 8 % (ref 3.0–12.0)
NEUTROS PCT: 72.2 % (ref 43.0–77.0)
Neutro Abs: 2.7 10*3/uL (ref 1.4–7.7)
Platelets: 223 10*3/uL (ref 150.0–400.0)
RBC: 3.17 Mil/uL — ABNORMAL LOW (ref 4.22–5.81)
RDW: 14.2 % (ref 11.5–15.5)
WBC: 3.8 10*3/uL — ABNORMAL LOW (ref 4.0–10.5)

## 2014-01-13 NOTE — Patient Instructions (Signed)
You have been scheduled for a colonoscopy. Please follow written instructions given to you at your visit today. For the 2 day prep, purchase Miralax 238 gram bottle, box of Dulcolax tablets, 32 oz bottle of gatorade, ( not red or purtle).     For the rest of the colonoscopy prep we have given you a sample of the colonoscopy prep.Suprep.  If you use inhalers (even only as needed), please bring them with you on the day of your procedure. Your physician has requested that you go to www.startemmi.com and enter the access code given to you at your visit today. This web site gives a general overview about your procedure. However, you should still follow specific instructions given to you by our office regarding your preparation for the procedure.

## 2014-01-13 NOTE — Progress Notes (Signed)
HPI :  Patient is a 63 year old male, [place], known to Dr. Sharlett Iles from a previous colonoscopy. 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  He is s/p resection with left neck dissection. Negative margins but 2 lymph nodes were positive. Patient is s/p chemoradiation, both finished around September 2014. Last visit with Oncology February 2015 at which time patient deemed cancer free.   Patient referred here for evaluation of anemia. Most of his care at been at [facility], he recently switched to local PCP. Hgb mid 10 range in Feb 2015 no labs in [facility] since then). PCP got labs earlier this month and hgb 8.0, iron was started. No overt bleeding. Patient occasionally takes Advil. No abdominal pain. No vomiting. He has chronic dysphagia after head and neck surgery but that is steadily improving. Dad had colon cancer in his 56's. Patient tells me he just turned in stool cards for blood and they were negative. His colonoscopy here in 2011 was incomplete secondary to poor prep and redundant colon.     Past Medical History  Diagnosis Date  . Arthritis   . DVT (deep venous thrombosis) 2006    associated with surgery  . PE (pulmonary embolism) 2006    associated with surgery  . Diverticulosis   . Hypertension     [facility] [place]  . Tongue cancer   . Anemia, unspecified 04/29/2013  . Leukopenia 04/29/2013  . Fatigue 06/27/2013  . Hypothyroidism 09/02/2013    Family History  Problem Relation Age of Onset  . Prostate cancer    . Asthma Son   . Arthritis/Rheumatoid Mother   . Colon cancer Father 42   History  Substance Use Topics  . Smoking status: Former Smoker -- 1.00 packs/day for 30 years    Quit date: 12/22/2012  . Smokeless tobacco: Never Used  . Alcohol Use: Yes     Comment: one beer or bourbon drink per week   Current Outpatient Prescriptions  Medication Sig Dispense Refill  . calcium carbonate 200 MG capsule Take 250 mg by mouth daily.      Marland Kitchen docusate sodium  (COLACE) 100 MG capsule Take 100 mg by mouth daily as needed for mild constipation.      . Fe Fum-FePoly-FA-Vit C-Vit B3 (INTEGRA F) 125-1 MG CAPS Take 1 capsule by mouth daily.      . fentaNYL (DURAGESIC - DOSED MCG/HR) 25 MCG/HR patch Place 25 mcg onto the skin every other day.      . levothyroxine (SYNTHROID, LEVOTHROID) 50 MCG tablet Take 50 mcg by mouth daily before breakfast.      . lisinopril (PRINIVIL,ZESTRIL) 40 MG tablet Take 40 mg by mouth daily. Take 1/2 tab daily.      . Omega-3 Fatty Acids (FISH OIL) 1000 MG CAPS Take 1 capsule by mouth daily.      Marland Kitchen oxyCODONE (OXY IR/ROXICODONE) 5 MG immediate release tablet Take 5 mg by mouth 3 (three) times daily.      . simvastatin (ZOCOR) 10 MG tablet Take 10 mg by mouth at bedtime.       No current facility-administered medications for this visit.   No Known Allergies   Review of Systems: All systems reviewed and negative except where noted in HPI.   Physical Exam: BP 126/72  Pulse 84  Ht 5\' 7"  (1.702 m)  Wt 132 lb (59.875 kg)  BMI 20.67 kg/m2 Constitutional: Pleasant,well-developed, white male in no acute distress. HEENT: Normocephalic  and atraumatic. Conjunctivae are normal. No scleral icterus. Neck supple.  Cardiovascular: Normal rate, regular rhythm.  Pulmonary/chest: Effort normal and breath sounds normal. No wheezing, rales or rhonchi. Abdominal: Soft, nondistended, nontender. Bowel sounds active throughout. There are no masses palpable. No hepatomegaly. Extremities: Left AKA  Lymphadenopathy: No cervical adenopathy noted. Neurological: Alert and oriented to person place and time. Skin: Skin is warm and dry. No rashes noted. Psychiatric: Normal mood and affect. Behavior is normal.   ASSESSMENT AND PLAN:  1. Macrocytic anemia with hgb of 8. No baseline labs to compare other than around time patient was getting or had recently completed chemoradiation for lung cancer. As far as macrocytosis, his B12 level is high,  folate normal. TSH okay, doesn't drink much ETOH.  No overt bleeding, heme negative, ferritin okay (even prior to starting iron a few weeks ago). Patient may have underlying hematologic problem, especially given #2. He does need a colonoscopy however given that one in 2011 was incomplete. Will give two day prep. Repeat CBC today   2. Leukopenia and thrombocytopenia, chronic. May need referral back to hematology.   3. History of tongue cancer, now cancer free per Oncology. See HPI.

## 2014-01-14 NOTE — Progress Notes (Signed)
Reviewed and agree with management. Robert D. Kaplan, M.D., FACG  

## 2014-02-20 ENCOUNTER — Encounter: Payer: Self-pay | Admitting: Gastroenterology

## 2014-02-20 ENCOUNTER — Ambulatory Visit (AMBULATORY_SURGERY_CENTER): Payer: Medicare Other | Admitting: Gastroenterology

## 2014-02-20 VITALS — BP 107/59 | HR 56 | Temp 97.1°F | Resp 19 | Ht 67.0 in | Wt 132.0 lb

## 2014-02-20 DIAGNOSIS — D126 Benign neoplasm of colon, unspecified: Secondary | ICD-10-CM

## 2014-02-20 DIAGNOSIS — D649 Anemia, unspecified: Secondary | ICD-10-CM

## 2014-02-20 MED ORDER — SODIUM CHLORIDE 0.9 % IV SOLN: 500.0000 mL | INTRAVENOUS | Status: DC

## 2014-02-20 NOTE — Patient Instructions (Signed)
YOU HAD AN ENDOSCOPIC PROCEDURE TODAY AT THE [facility]: Refer to the procedure report that was given to you for any specific questions about what was found during the examination.  If the procedure report does not answer your questions, please call your gastroenterologist to clarify.  If you requested that your care partner not be given the details of your procedure findings, then the procedure report has been included in a sealed envelope for you to review at your convenience later.  YOU SHOULD EXPECT: Some feelings of bloating in the abdomen. Passage of more gas than usual.  Walking can help get rid of the air that was put into your GI tract during the procedure and reduce the bloating. If you had a lower endoscopy (such as a colonoscopy or flexible sigmoidoscopy) you may notice spotting of blood in your stool or on the toilet paper. If you underwent a bowel prep for your procedure, then you may not have a normal bowel movement for a few days.  DIET: Your first meal following the procedure should be a light meal and then it is ok to progress to your normal diet.  A half-sandwich or bowl of soup is an example of a good first meal.  Heavy or fried foods are harder to digest and may make you feel nauseous or bloated.  Likewise meals heavy in dairy and vegetables can cause extra gas to form and this can also increase the bloating.  Drink plenty of fluids but you should avoid alcoholic beverages for 24 hours.  ACTIVITY: Your care partner should take you home directly after the procedure.  You should plan to take it easy, moving slowly for the rest of the day.  You can resume normal activity the day after the procedure however you should NOT DRIVE or use heavy machinery for 24 hours (because of the sedation medicines used during the test).    SYMPTOMS TO REPORT IMMEDIATELY: A gastroenterologist can be reached at any hour.  During normal business hours, 8:30 AM to 5:00 PM Monday through Friday,  call [phone].  After hours and on weekends, please call the GI answering service at [phone] who will take a message and have the physician on call contact you.   Following lower endoscopy (colonoscopy or flexible sigmoidoscopy):  Excessive amounts of blood in the stool  Significant tenderness or worsening of abdominal pains  Swelling of the abdomen that is new, acute  Fever of 100F or higher FOLLOW UP: If any biopsies were taken you will be contacted by phone or by letter within the next 1-3 weeks.  Call your gastroenterologist if you have not heard about the biopsies in 3 weeks.  Our staff will call the home number listed on your records the next business day following your procedure to check on you and address any questions or concerns that you may have at that time regarding the information given to you following your procedure. This is a courtesy call and so if there is no answer at the home number and we have not heard from you through the emergency physician on call, we will assume that you have returned to your regular daily activities without incident.  SIGNATURES/CONFIDENTIALITY: You and/or your care partner have signed paperwork which will be entered into your electronic medical record.  These signatures attest to the fact that that the information above on your After Visit Summary has been reviewed and is understood.  Full responsibility of the confidentiality of this discharge   information lies with you and/or your care-partner.  Polyp information given. 

## 2014-02-20 NOTE — Progress Notes (Signed)
Procedure ends, to recovery report given and VSS.

## 2014-02-20 NOTE — Op Note (Signed)
[facility]  [address] [place], [id number]   COLONOSCOPY PROCEDURE REPORT  PATIENT: Brent, [name]  MR#: [id number] BIRTHDATE: Jul 09, 1951 , 2  yrs. old GENDER: Male ENDOSCOPIST: Inda Castle, MD REFERRED JO:INOMV Ashby Dawes, M.D. PROCEDURE DATE:  02/20/2014 PROCEDURE:   Colonoscopy with snare polypectomy First Screening Colonoscopy - Avg.  risk and is 50 yrs.  old or older - No.  Prior Negative Screening - Now for repeat screening. N/A  History of Adenoma - Now for follow-up colonoscopy & has been > or = to 3 yrs.  N/A  Polyps Removed Today? Yes. ASA CLASS:   Class II INDICATIONS:Anemia, non-specific. MEDICATIONS: MAC sedation, administered by CRNA and propofol (Diprivan) 150mg  IV  DESCRIPTION OF PROCEDURE:   After the risks benefits and alternatives of the procedure were thoroughly explained, informed consent was obtained.  A digital rectal exam revealed no abnormalities of the rectum.   The LB [id number]  endoscope was introduced through the anus and advanced to the cecum, which was identified by both the appendix and ileocecal valve. No adverse events experienced.   The quality of the prep was Suprep adequate The instrument was then slowly withdrawn as the colon was fully examined.      COLON FINDINGS: A sessile polyp measuring 12 mm in size was found in the sigmoid colon.  A polypectomy was performed using snare cautery.  A nonbleeding polypectomy was performed using snare cautery.  The resection was complete and the polyp tissue was completely retrieved.   The colon was otherwise normal.  There was no diverticulosis, inflammation, polyps or cancers unless previously stated.  Retroflexed views revealed no abnormalities. The time to cecum=9 minutes 05 seconds.  Withdrawal time=12 minutes 33 seconds.  The scope was withdrawn and the procedure completed. COMPLICATIONS: There were no complications.  ENDOSCOPIC IMPRESSION: 1.   Sessile  polyp measuring 12 mm in size was found in the sigmoid colon; polypectomy was performed using snare cautery; polypectomy was performed using snare cautery 2.   The colon was otherwise normal  RECOMMENDATIONS: If the polyp(s) removed today are proven to be adenomatous (pre-cancerous) polyps, you will need a colonoscopy in 3 years. Otherwise you should continue to follow colorectal cancer screening guidelines for "routine risk" patients with a colonoscopy in 10 years.  You will receive a letter within 1-2 weeks with the results of your biopsy as well as final recommendations.  Please call my office if you have not received a letter after 3 weeks.   eSigned:  Inda Castle, MD 02/20/2014 3:59 PM   cc:   PATIENT NAME:  Brent, [name] MR#: [id number]

## 2014-02-20 NOTE — Progress Notes (Signed)
Called to room to assist during endoscopic procedure.  Patient ID and intended procedure confirmed with present staff. Received instructions for my participation in the procedure from the performing physician.  

## 2014-02-23 ENCOUNTER — Telehealth: Payer: Self-pay

## 2014-02-23 NOTE — Telephone Encounter (Signed)
Left message on answering machine. 

## 2014-02-24 ENCOUNTER — Other Ambulatory Visit (HOSPITAL_BASED_OUTPATIENT_CLINIC_OR_DEPARTMENT_OTHER): Payer: Medicare Other

## 2014-02-24 ENCOUNTER — Ambulatory Visit (HOSPITAL_COMMUNITY)
Admission: RE | Admit: 2014-02-24 | Discharge: 2014-02-24 | Disposition: A | Discharge status: Home / Self Care | Payer: Medicare Other | Source: Ambulatory Visit | Attending: Hematology and Oncology | Admitting: Hematology and Oncology

## 2014-02-24 DIAGNOSIS — C029 Malignant neoplasm of tongue, unspecified: Secondary | ICD-10-CM

## 2014-02-24 DIAGNOSIS — I658 Occlusion and stenosis of other precerebral arteries: Secondary | ICD-10-CM | POA: Insufficient documentation

## 2014-02-24 DIAGNOSIS — E039 Hypothyroidism, unspecified: Secondary | ICD-10-CM

## 2014-02-24 DIAGNOSIS — D649 Anemia, unspecified: Secondary | ICD-10-CM

## 2014-02-24 DIAGNOSIS — I6529 Occlusion and stenosis of unspecified carotid artery: Secondary | ICD-10-CM | POA: Insufficient documentation

## 2014-02-24 DIAGNOSIS — J438 Other emphysema: Secondary | ICD-10-CM | POA: Diagnosis not present

## 2014-02-24 LAB — CBC WITH DIFFERENTIAL/PLATELET
BASO%: 1.3 % (ref 0.0–2.0)
Basophils Absolute: 0 10*3/uL (ref 0.0–0.1)
EOS ABS: 0.3 10*3/uL (ref 0.0–0.5)
EOS%: 8.6 % — ABNORMAL HIGH (ref 0.0–7.0)
HEMATOCRIT: 36.1 % — AB (ref 38.4–49.9)
HGB: 11.7 g/dL — ABNORMAL LOW (ref 13.0–17.1)
LYMPH%: 19 % (ref 14.0–49.0)
MCH: 30.5 pg (ref 27.2–33.4)
MCHC: 32.5 g/dL (ref 32.0–36.0)
MCV: 93.7 fL (ref 79.3–98.0)
MONO#: 0.3 10*3/uL (ref 0.1–0.9)
MONO%: 8 % (ref 0.0–14.0)
NEUT#: 2.1 10*3/uL (ref 1.5–6.5)
NEUT%: 63.1 % (ref 39.0–75.0)
PLATELETS: 190 10*3/uL (ref 140–400)
RBC: 3.85 10*6/uL — AB (ref 4.20–5.82)
RDW: 13.6 % (ref 11.0–14.6)
WBC: 3.3 10*3/uL — AB (ref 4.0–10.3)
lymph#: 0.6 10*3/uL — ABNORMAL LOW (ref 0.9–3.3)

## 2014-02-24 LAB — COMPREHENSIVE METABOLIC PANEL (CC13)
ALBUMIN: 4.1 g/dL (ref 3.5–5.0)
ALK PHOS: 43 U/L (ref 40–150)
ALT: 22 U/L (ref 0–55)
AST: 39 U/L — AB (ref 5–34)
Anion Gap: 9 mEq/L (ref 3–11)
BILIRUBIN TOTAL: 0.39 mg/dL (ref 0.20–1.20)
BUN: 25.4 mg/dL (ref 7.0–26.0)
CO2: 29 mEq/L (ref 22–29)
Calcium: 9.8 mg/dL (ref 8.4–10.4)
Chloride: 102 mEq/L (ref 98–109)
Creatinine: 1.1 mg/dL (ref 0.7–1.3)
Glucose: 122 mg/dl (ref 70–140)
POTASSIUM: 4.6 meq/L (ref 3.5–5.1)
Sodium: 139 mEq/L (ref 136–145)
Total Protein: 6.9 g/dL (ref 6.4–8.3)

## 2014-02-24 LAB — TSH CHCC: TSH: 1.949 m(IU)/L (ref 0.320–4.118)

## 2014-02-24 LAB — T4, FREE: FREE T4: 1.19 ng/dL (ref 0.80–1.80)

## 2014-02-24 MED ORDER — IOHEXOL 300 MG/ML  SOLN
100.0000 mL | Freq: Once | INTRAMUSCULAR | Status: AC | PRN
  Administered 2014-02-24: 100 mL via INTRAVENOUS

## 2014-02-26 ENCOUNTER — Ambulatory Visit: Payer: Self-pay | Admitting: Pain Medicine

## 2014-02-27 ENCOUNTER — Encounter: Payer: Self-pay | Admitting: Gastroenterology

## 2014-03-02 ENCOUNTER — Ambulatory Visit: Payer: Self-pay | Admitting: Pain Medicine

## 2014-03-03 ENCOUNTER — Encounter: Payer: Self-pay | Admitting: Hematology and Oncology

## 2014-03-03 ENCOUNTER — Ambulatory Visit (HOSPITAL_BASED_OUTPATIENT_CLINIC_OR_DEPARTMENT_OTHER): Payer: Medicare Other | Admitting: Hematology and Oncology

## 2014-03-03 VITALS — BP 116/72 | HR 66 | Temp 98.5°F | Resp 18 | Ht 67.0 in | Wt 130.4 lb

## 2014-03-03 DIAGNOSIS — E039 Hypothyroidism, unspecified: Secondary | ICD-10-CM

## 2014-03-03 DIAGNOSIS — Z859 Personal history of malignant neoplasm, unspecified: Secondary | ICD-10-CM

## 2014-03-03 DIAGNOSIS — C029 Malignant neoplasm of tongue, unspecified: Secondary | ICD-10-CM

## 2014-03-03 DIAGNOSIS — D72819 Decreased white blood cell count, unspecified: Secondary | ICD-10-CM

## 2014-03-03 DIAGNOSIS — D649 Anemia, unspecified: Secondary | ICD-10-CM

## 2014-03-03 NOTE — Assessment & Plan Note (Signed)
This is likely anemia of chronic disease. The patient denies recent history of bleeding such as epistaxis, hematuria or hematochezia. He is asymptomatic from the anemia. We will observe for now.  He does not require transfusion now.   

## 2014-03-03 NOTE — Assessment & Plan Note (Signed)
He is not on thyroid replacement therapy by his ENT surgeon.

## 2014-03-03 NOTE — Assessment & Plan Note (Signed)
History, physical examination, blood work and imaging study showed no evidence of recurrence. I would discontinue routine imaging study. I recommend close followup with ENT clinic. I will see him back a year from now with history, physical examination and blood work only.

## 2014-03-03 NOTE — Progress Notes (Signed)
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    INTERVAL HISTORY: Please see below for problem oriented charting. He denies new lumps in his neck. His swallowing has improved. Denies dysphagia. His most recent ENT followup was negative for recurrence. He continues to have chronic musculoskeletal pain and has been seen by a pain specialist.  REVIEW OF SYSTEMS:   Constitutional: Denies fevers, chills or abnormal weight loss Eyes: Denies blurriness of vision Ears, nose, mouth, throat, and face: Denies mucositis or sore throat Respiratory: Denies cough, dyspnea or wheezes Cardiovascular: Denies palpitation, chest discomfort or lower extremity swelling Gastrointestinal:  Denies nausea, heartburn or change in bowel habits Skin: Denies abnormal skin rashes Lymphatics: Denies new lymphadenopathy or easy bruising Neurological:Denies numbness, tingling or new weaknesses Behavioral/Psych: Mood is stable, no new changes  All other systems were reviewed with the patient and are  negative.  I have reviewed the past medical history, past surgical history, social history and family history with the patient and they are unchanged from previous note.  ALLERGIES:  is allergic to cymbalta and lyrica.  MEDICATIONS:  Current Outpatient Prescriptions  Medication Sig Dispense Refill  . ALPRAZolam (XANAX) 0.5 MG tablet Take 0.5 mg by mouth at bedtime as needed for anxiety.      . calcium carbonate 200 MG capsule Take 250 mg by mouth daily.      . cefUROXime (CEFTIN) 250 MG tablet       . docusate sodium (COLACE) 100 MG capsule Take 100 mg by mouth daily as needed for mild constipation.      . Fe Fum-FePoly-FA-Vit C-Vit B3 (INTEGRA F) 125-1 MG CAPS Take 1 capsule by mouth daily.      . fentaNYL (DURAGESIC - DOSED MCG/HR) 25 MCG/HR patch Place 25 mcg onto the skin every other day.      . levothyroxine (SYNTHROID, LEVOTHROID) 50 MCG tablet Take 50 mcg by mouth daily before breakfast.      . lisinopril (PRINIVIL,ZESTRIL) 40 MG tablet Take 40 mg by mouth daily. Take 1/2 tab daily.      . Omega-3 Fatty Acids (FISH OIL) 1000 MG CAPS Take 1 capsule by mouth daily.      Marland Kitchen oxyCODONE (OXY IR/ROXICODONE) 5 MG immediate release tablet Take 5 mg by mouth 3 (three) times daily.      . simvastatin (ZOCOR) 10 MG tablet Take 10 mg by mouth at bedtime.       No current facility-administered medications for this visit.    PHYSICAL EXAMINATION: ECOG PERFORMANCE STATUS: 0 - Asymptomatic  Filed Vitals:  03/03/14 1247  BP: 116/72  Pulse: 66  Temp: 98.5 F (36.9 C)  Resp: 18   Filed Weights   03/03/14 1247  Weight: 130 lb 6.4 oz (59.149 kg)    GENERAL:alert, no distress and comfortable SKIN: skin color, texture, turgor are normal, no rashes or significant lesions EYES: normal, Conjunctiva are pink and non-injected, sclera clear OROPHARYNX:no exudate, no erythema and lips, buccal mucosa. Noted partial tongue surgery with a small blister at the tip of the tongue. No signs of recurrence  otherwise. NECK: Neck is thick from prior radiation fibrosis, thyroid normal size, non-tender, without nodularity LYMPH:  no palpable lymphadenopathy in the cervical, axillary or inguinal LUNGS: clear to auscultation and percussion with normal breathing effort HEART: regular rate & rhythm and no murmurs and no lower extremity edema ABDOMEN:abdomen soft, non-tender and normal bowel sounds Musculoskeletal:no cyanosis of digits and no clubbing  NEURO: alert & oriented x 3 with fluent speech, no focal motor/sensory deficits  LABORATORY DATA:  I have reviewed the data as listed    Component Value Date/Time   NA 139 02/24/2014 1308   NA 136 02/18/2013 1200   K 4.6 02/24/2014 1308   K 4.6 02/18/2013 1200   CL 100 02/18/2013 1200   CL 106 01/10/2013 1421   CO2 29 02/24/2014 1308   CO2 27 02/18/2013 1200   GLUCOSE 122 02/24/2014 1308   GLUCOSE 97 02/18/2013 1200   GLUCOSE 113* 01/10/2013 1421   BUN 25.4 02/24/2014 1308   BUN 52* 02/18/2013 1200   CREATININE 1.1 02/24/2014 1308   CREATININE 1.00 02/18/2013 1200   CALCIUM 9.8 02/24/2014 1308   CALCIUM 9.4 02/18/2013 1200   PROT 6.9 02/24/2014 1308   ALBUMIN 4.1 02/24/2014 1308   AST 39* 02/24/2014 1308   ALT 22 02/24/2014 1308   ALKPHOS 43 02/24/2014 1308   BILITOT 0.39 02/24/2014 1308   GFRNONAA 78* 02/18/2013 1200   GFRAA >90 02/18/2013 1200    No results found for this basename: SPEP, UPEP,  kappa and lambda light chains    Lab Results  Component Value Date   WBC 3.3* 02/24/2014   NEUTROABS 2.1 02/24/2014   HGB 11.7* 02/24/2014   HCT 36.1* 02/24/2014   MCV 93.7 02/24/2014   PLT 190 02/24/2014      Chemistry      Component Value Date/Time   NA 139 02/24/2014 1308   NA 136 02/18/2013 1200   K 4.6 02/24/2014 1308   K 4.6 02/18/2013 1200   CL 100 02/18/2013 1200   CL 106 01/10/2013 1421   CO2 29 02/24/2014 1308   CO2 27 02/18/2013 1200   BUN 25.4 02/24/2014 1308   BUN 52* 02/18/2013 1200   CREATININE 1.1 02/24/2014 1308   CREATININE 1.00 02/18/2013 1200      Component Value  Date/Time   CALCIUM 9.8 02/24/2014 1308   CALCIUM 9.4 02/18/2013 1200   ALKPHOS 43 02/24/2014 1308   AST 39* 02/24/2014 1308   ALT 22 02/24/2014 1308   BILITOT 0.39 02/24/2014 1308       RADIOGRAPHIC STUDIES: I reviewed his CT scan which show no evidence of disease. I have personally reviewed the radiological images as listed and agreed with the findings in the report.  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 I would discontinue routine imaging study. I recommend close followup with ENT clinic. I will see him back  a year from now with history, physical examination and blood work only.  Leukopenia This is chronic, likely residual side effects from prior treatment. He is not symptomatic. I recommend close observation and I will see him back a year from now with repeat blood work.  Hypothyroidism He is not on thyroid replacement therapy by his ENT surgeon.  Anemia, unspecified This is likely anemia of chronic disease. The patient denies recent history of bleeding such as epistaxis, hematuria or hematochezia. He is asymptomatic from the anemia. We will observe for now.  He does not require transfusion now.     Orders Placed This Encounter  Procedures  . CBC with Differential    Standing Status: Future     Number of Occurrences:      Standing Expiration Date: 04/07/2015  . Comprehensive metabolic panel    Standing Status: Future     Number of Occurrences:      Standing Expiration Date: 04/07/2015   All questions were answered. The patient knows to call the clinic with any problems, questions or concerns. No barriers to learning was detected. I spent 25 minutes counseling the patient face to face. The total time spent in the appointment was 30 minutes and more than 50% was on counseling and review of test results     [facility], [place], MD 03/03/2014 1:20 PM

## 2014-03-03 NOTE — Assessment & Plan Note (Signed)
This is chronic, likely residual side effects from prior treatment. He is not symptomatic. I recommend close observation and I will see him back a year from now with repeat blood work.

## 2014-03-05 ENCOUNTER — Telehealth: Payer: Self-pay | Admitting: Hematology and Oncology

## 2014-03-05 NOTE — Telephone Encounter (Signed)
Lvm advising appt 03/09/15. Also mailed appt calendar.

## 2014-03-31 ENCOUNTER — Ambulatory Visit: Payer: Self-pay | Admitting: Pain Medicine

## 2014-04-02 ENCOUNTER — Emergency Department (HOSPITAL_COMMUNITY)
Admission: EM | Admit: 2014-04-02 | Discharge: 2014-04-02 | Disposition: A | Discharge status: Home / Self Care | Payer: Medicare Other | Attending: Emergency Medicine | Admitting: Emergency Medicine

## 2014-04-02 ENCOUNTER — Encounter (HOSPITAL_COMMUNITY): Payer: Self-pay | Admitting: Emergency Medicine

## 2014-04-02 DIAGNOSIS — Z9710 Presence of artificial limb (complete) (partial), unspecified: Secondary | ICD-10-CM | POA: Insufficient documentation

## 2014-04-02 DIAGNOSIS — Z8709 Personal history of other diseases of the respiratory system: Secondary | ICD-10-CM | POA: Insufficient documentation

## 2014-04-02 DIAGNOSIS — D649 Anemia, unspecified: Secondary | ICD-10-CM | POA: Diagnosis not present

## 2014-04-02 DIAGNOSIS — Z85810 Personal history of malignant neoplasm of tongue: Secondary | ICD-10-CM | POA: Insufficient documentation

## 2014-04-02 DIAGNOSIS — I10 Essential (primary) hypertension: Secondary | ICD-10-CM | POA: Diagnosis not present

## 2014-04-02 DIAGNOSIS — G8929 Other chronic pain: Secondary | ICD-10-CM | POA: Insufficient documentation

## 2014-04-02 DIAGNOSIS — Z8739 Personal history of other diseases of the musculoskeletal system and connective tissue: Secondary | ICD-10-CM | POA: Insufficient documentation

## 2014-04-02 DIAGNOSIS — Z760 Encounter for issue of repeat prescription: Secondary | ICD-10-CM | POA: Diagnosis present

## 2014-04-02 DIAGNOSIS — E039 Hypothyroidism, unspecified: Secondary | ICD-10-CM | POA: Diagnosis not present

## 2014-04-02 DIAGNOSIS — Z86718 Personal history of other venous thrombosis and embolism: Secondary | ICD-10-CM | POA: Insufficient documentation

## 2014-04-02 DIAGNOSIS — S88119A Complete traumatic amputation at level between knee and ankle, unspecified lower leg, initial encounter: Secondary | ICD-10-CM | POA: Diagnosis not present

## 2014-04-02 DIAGNOSIS — Z8719 Personal history of other diseases of the digestive system: Secondary | ICD-10-CM | POA: Diagnosis not present

## 2014-04-02 DIAGNOSIS — Z87891 Personal history of nicotine dependence: Secondary | ICD-10-CM | POA: Insufficient documentation

## 2014-04-02 DIAGNOSIS — Z79899 Other long term (current) drug therapy: Secondary | ICD-10-CM | POA: Diagnosis not present

## 2014-04-02 IMAGING — XA IR EVAL AND MANAGEMENT
1 series · 6 of 6 positions shown · non-contrast
Comparison: none

CLINICAL HISTORY: History of tongue cancer and currently getting
radiation.  The patient had a percutaneous gastrostomy tube placed
on 02/18/2013.  The gastrostomy tube has been leaking since
placement.  The patient complains that the tube leaks after he
eats. He is not currently using the gastrostomy tube for feeding.

[Series 300: ir radiologist eval & mgmt · 6 of 6 slices shown]
[im 1/6]
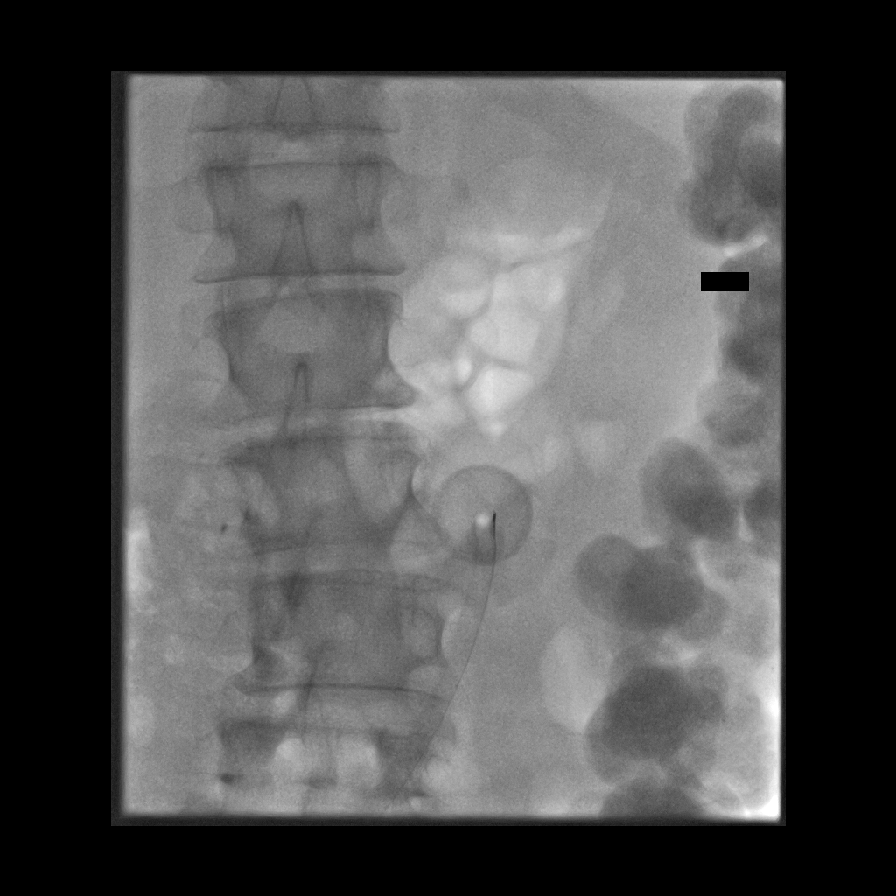
[im 2/6]
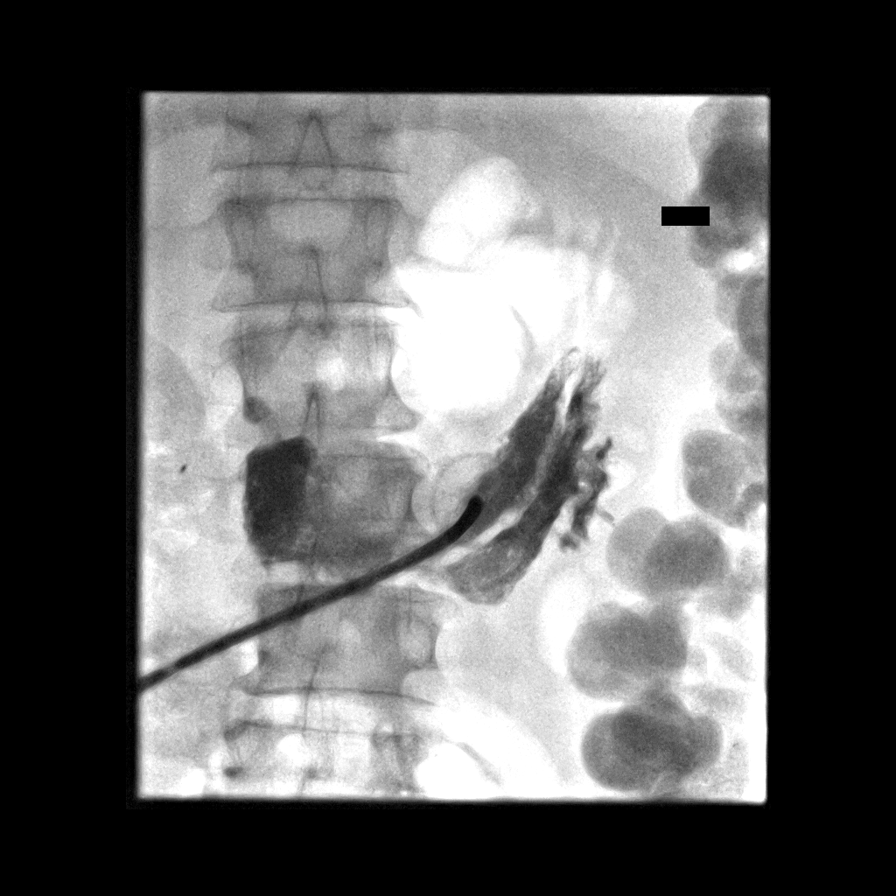
[im 3/6]
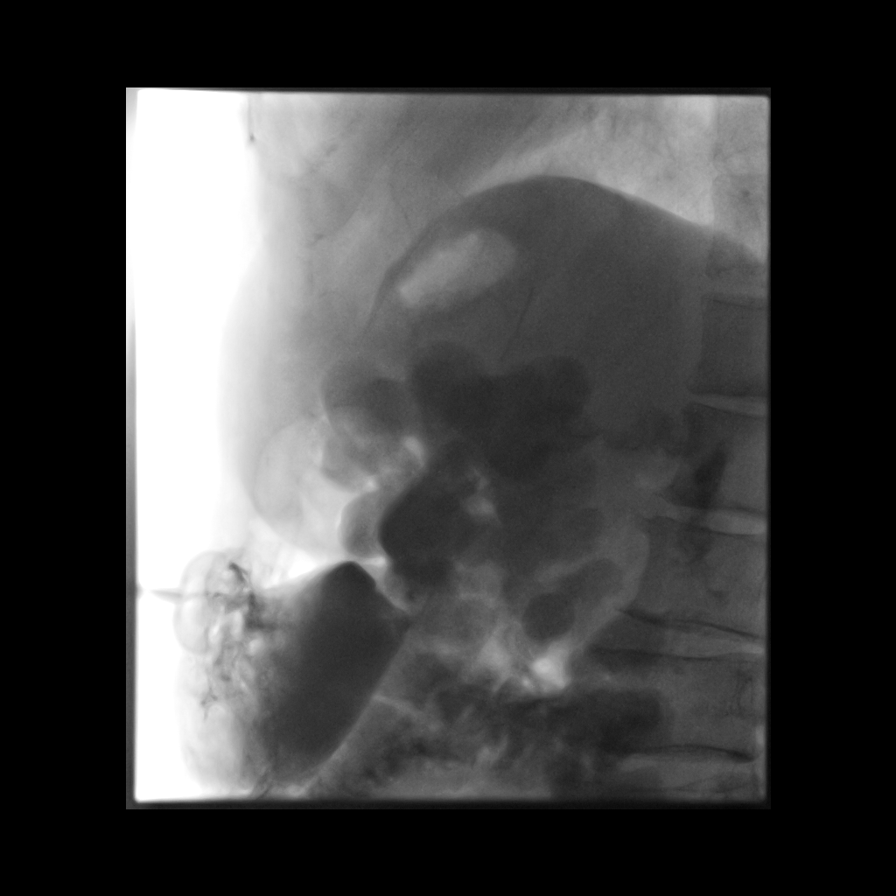
[im 4/6]
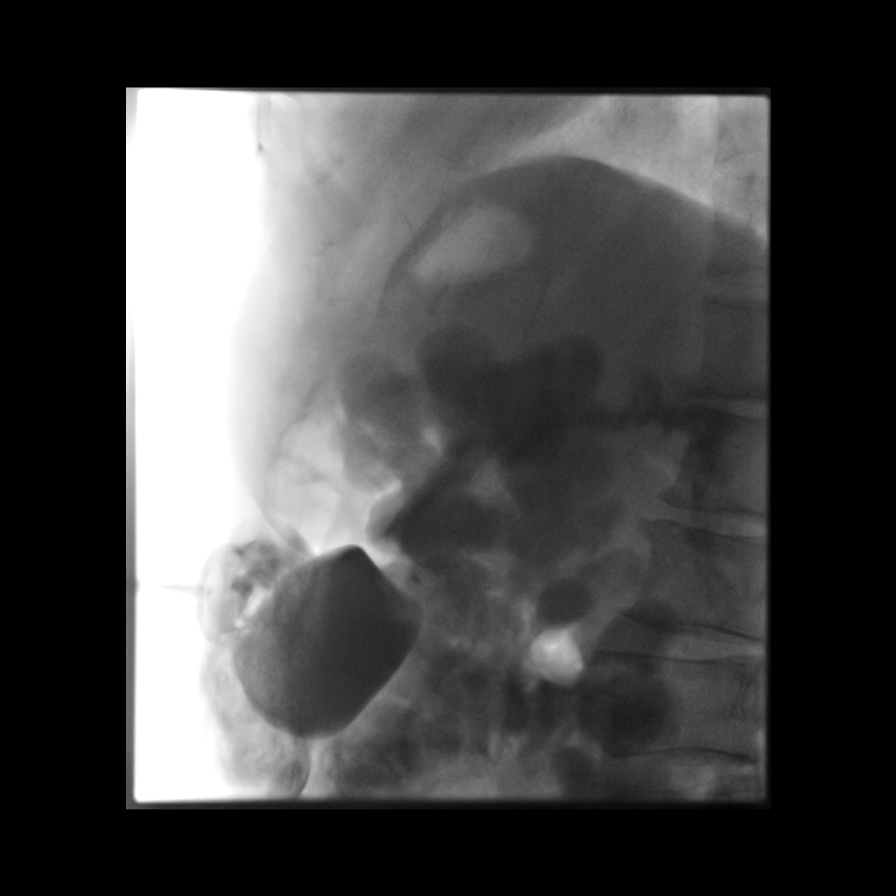
[im 5/6]
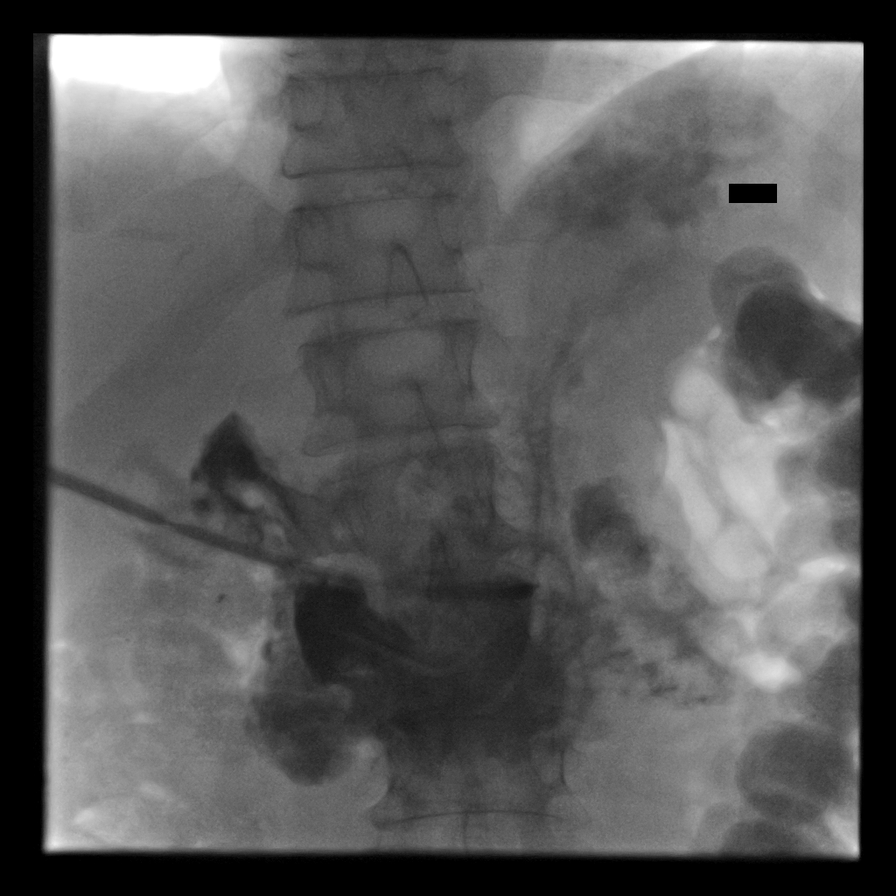
[im 6/6]
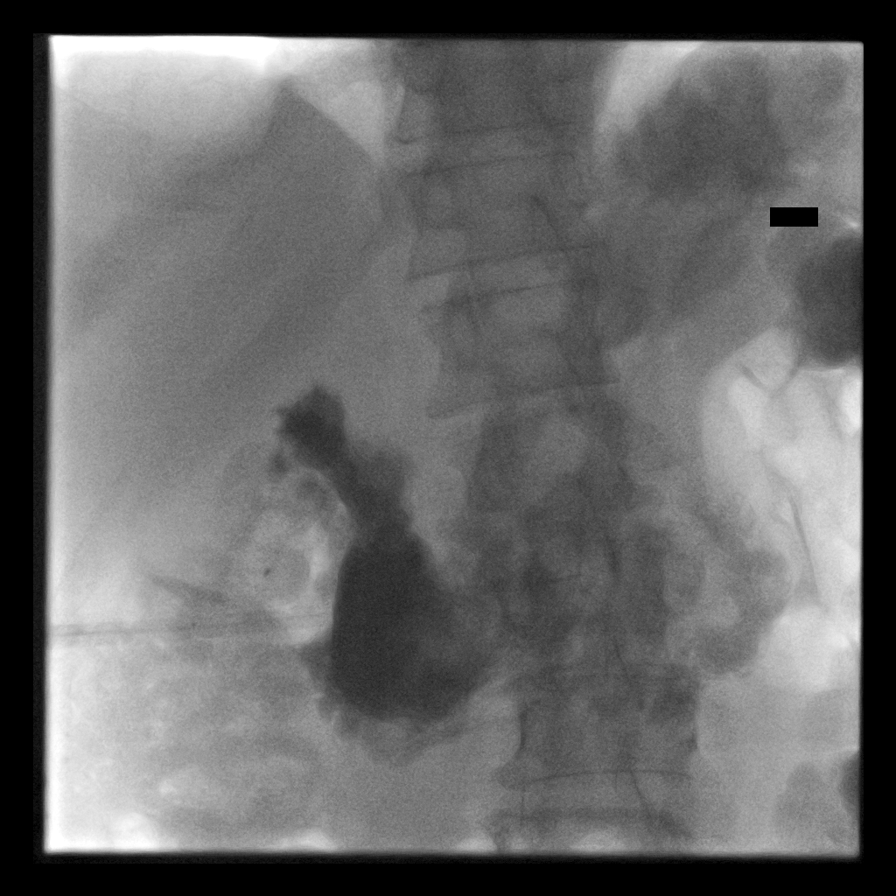

[6 of 6 positions shown; findings below may reference images not displayed]

PROCEDURE(S): INJECTION OF GASTROSTOMY TUBE

Medications:None

Moderate sedation time:None

Fluoroscopy time: 1 minute and 42 seconds

Procedure:The skin around the gastrostomy tube is very red and
irritated.  Small amount of leakage was identified with mild
manipulation.  The patient was brought to the fluoroscopic suite.
The tube was injected with Omnipaque under fluoroscopy.  The
patient was placed in a right lateral decubitus position and
additional contrast and water was injected.  Catheter was flushed
with water at the end of the procedure.
FINDINGS: Gastrostomy tube is well positioned in the stomach.  There
is no evidence for contrast leaking on the skin when the patient
was in the right lateral decubitus position.  In addition, contrast
was emptying into the small bowel.  There is retained contrast in
the colon from the exam 5 days ago.

Complications: None
IMPRESSION: Gastrostomy tube is well positioned.

The patient complains of leakage around the gastrostomy tube after
eating and drinking.  I suspect that the patient has a
gastroparesis and/or very slow bowel function based on the residual
contrast from 5 days ago.  No evidence for leakage when the patient
is in the right lateral decubitus position.  I am concerned about
the skin irritation around the gastrostomy tube and feel that we
need to divert feedings beyond the stomach until the skin can heal.
Plan to convert the gastrostomy tube to a G J-tube on [DATE].

## 2014-04-02 MED ORDER — HYDROMORPHONE HCL PF 1 MG/ML IJ SOLN
2.0000 mg | Freq: Once | INTRAMUSCULAR | Status: AC
  Administered 2014-04-02: 2 mg via INTRAMUSCULAR
  Filled 2014-04-02: qty 2

## 2014-04-02 MED ORDER — FLUORESCEIN SODIUM 1 MG OP STRP: 1.0000 | ORAL_STRIP | Freq: Once | OPHTHALMIC | Status: DC

## 2014-04-02 MED ORDER — PROPARACAINE HCL 0.5 % OP SOLN: 1.0000 [drp] | Freq: Once | OPHTHALMIC | Status: DC

## 2014-04-02 NOTE — Discharge Instructions (Signed)
Chronic Pain Discharge Instructions  Emergency care providers appreciate that many patients coming to [place] are in severe pain and we wish to address their pain in the safest, most responsible manner.  It is important to recognize however, that the proper treatment of chronic pain differs from that of the pain of injuries and acute illnesses.  Our goal is to provide quality, safe, personalized care and we thank you for giving [place] the opportunity to serve you. The use of narcotics and related agents for chronic pain syndromes may lead to additional physical and psychological problems.  Nearly as many people die from prescription narcotics each year as die from car crashes.  Additionally, this risk is increased if such prescriptions are obtained from a variety of sources.  Therefore, only your primary care physician or a pain management specialist is able to safely treat such syndromes with narcotic medications long-term.    Documentation revealing such prescriptions have been sought from multiple sources may prohibit [place] from providing a refill or different narcotic medication.  Your name may be checked first through the [place].  This database is a record of controlled substance medication prescriptions that the patient has received.  This has been established by [facility] in an effort to eliminate the dangerous, and often life threatening, practice of obtaining multiple prescriptions from different medical providers.   If you have a chronic pain syndrome (i.e. chronic headaches, recurrent back or neck pain, dental pain, abdominal or pelvis pain without a specific diagnosis, or neuropathic pain such as fibromyalgia) or recurrent visits for the same condition without an acute diagnosis, you may be treated with non-narcotics and other non-addictive medicines.  Allergic reactions or negative side effects that may be reported by a patient to such medications will not  typically lead to the use of a narcotic analgesic or other controlled substance as an alternative.   Patients managing chronic pain with a personal physician should have provisions in place for breakthrough pain.  If you are in crisis, you should call your physician.  If your physician directs you to the emergency department, please have the doctor call and speak to our attending physician concerning your care.  Any patient who returns to the ED seeking refills should expect only non-narcotic pain medications.   In the event of an acute medical condition exists and the emergency physician feels it is necessary that the patient be given a narcotic or sedating medication -  a responsible adult driver should be present in the room prior to the medication being given by the nurse.   Prescriptions for narcotic or sedating medications that have been lost, stolen or expired will not be refilled in the Emergency Department.    Patients who have chronic pain may receive non-narcotic prescriptions until seen by their primary care physician.  It is every patients personal responsibility to maintain active prescriptions with his or her primary care physician or specialist.

## 2014-04-02 NOTE — ED Notes (Signed)
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  He is currently being treated at pain clinic with Dr Primus Bravo and will see him Sept 16.  He is out of his pain medication and states he is in pain to the point he screams at night.

## 2014-04-02 NOTE — ED Notes (Signed)
Pt here requesting pain meds; pt sts hx of chronic back and stump pain; pt sts seeing pain management but has not prescribed any narcotics; pt sts pain is chronic in nature; pt normally takes oxy 30mg 

## 2014-04-02 NOTE — ED Provider Notes (Signed)
CSN: [id number]     Arrival date & time 04/02/14  1040 History  This chart was scribed for non-physician practitioner Margarita Mail working with Houston Siren III, * by Donato Schultz, ED Scribe. This patient was seen in room TR06C/TR06C and the patient's care was started at 12:16 PM.     Chief Complaint  Patient presents with  . Medication Refill    The history is provided by the patient. No language interpreter was used.   HPI Comments: Brent [name] is a 63 y.o. male with a history of chronic back pain who presents to the Emergency Department needing a refill of his prescription pain medications. He states that he needs 30 mg oxy IRs.  He states that he is on 90% disability through the [place] and has just started pain management.  He was prescribed a fentanyl patch at [facility] but was unable to get it filled after some complications with his insurance company.  Patient state states that he complained about his treatment and was dismissed from the practice, he has also received narcotic pain medications from 2 other providers since that time. Denies weakness, loss of bowel/bladder function or saddle anesthesia. Denies neck stiffness, headache, rash.  Denies fever or recent procedures to back. He has no new complaints.    Past Medical History  Diagnosis Date  . Arthritis   . DVT (deep venous thrombosis) 2006    associated with surgery  . PE (pulmonary embolism) 2006    associated with surgery  . Diverticulosis   . [facility]ension     [facility] [place]  . Tongue cancer   . Anemia, unspecified 04/29/2013  . Leukopenia 04/29/2013  . Fatigue 06/27/2013  . Hypothyroidism 09/02/2013   Past Surgical History  Procedure Laterality Date  . Knee surgery Left   . Above knee leg amputation Left 2006    Dr. Eddie Dibbles  . Colonoscopy  2011  . Direct laryngoscopy N/A 01/23/2013    Procedure: DIRECT LARYNGOSCOPY; RIGID BRONCHOSCOPY;  Surgeon: Jodi Marble, MD;  Location: [facility];  Service:  ENT;  Laterality: N/A;  . Glossectomy Left 01/23/2013    Procedure: HEMIGLOSSECTOMY;  Surgeon: Jodi Marble, MD;  Location: [facility];  Service: ENT;  Laterality: Left;  . Radical neck dissection Left 01/23/2013    Procedure: LEFT SELECTIVE NECK DISSECTION;  Surgeon: Jodi Marble, MD;  Location: [facility];  Service: ENT;  Laterality: Left;  . Esophagoscopy N/A 01/23/2013    Procedure: ESOPHAGOSCOPY;  Surgeon: Jodi Marble, MD;  Location: [facility] OR;  Service: ENT;  Laterality: N/A;   Family History  Problem Relation Age of Onset  . Prostate cancer    . Asthma Son   . Arthritis/Rheumatoid Mother   . Colon cancer Father 36   History  Substance Use Topics  . Smoking status: Former Smoker -- 1.00 packs/day for 30 years    Quit date: 12/22/2012  . Smokeless tobacco: Never Used  . Alcohol Use: Yes     Comment: one beer or bourbon drink per week    Review of Systems    Allergies  Ambien; Cymbalta; and Lyrica  Home Medications   Prior to Admission medications   Medication Sig Start Date End Date Taking? Authorizing Provider  ALPRAZolam Duanne Moron) 0.5 MG tablet Take 0.5 mg by mouth at bedtime as needed for anxiety.    Historical Provider, MD  calcium carbonate 200 MG capsule Take 250 mg by mouth daily.    Historical Provider, MD  cefUROXime (CEFTIN) 250 MG tablet  03/02/14   Historical Provider, MD  docusate sodium (COLACE) 100 MG capsule Take 100 mg by mouth daily as needed for mild constipation.    Historical Provider, MD  Fe Fum-FePoly-FA-Vit C-Vit B3 (INTEGRA F) 125-1 MG CAPS Take 1 capsule by mouth daily.    Historical Provider, MD  fentaNYL (DURAGESIC - DOSED MCG/HR) 25 MCG/HR patch Place 25 mcg onto the skin every other day.    Historical Provider, MD  levothyroxine (SYNTHROID, LEVOTHROID) 50 MCG tablet Take 50 mcg by mouth daily before breakfast.    Historical Provider, MD  lisinopril (PRINIVIL,ZESTRIL) 40 MG tablet Take 40 mg by mouth daily. Take 1/2 tab daily.    Historical Provider, MD   Omega-3 Fatty Acids (FISH OIL) 1000 MG CAPS Take 1 capsule by mouth daily.    Historical Provider, MD  oxyCODONE (OXY IR/ROXICODONE) 5 MG immediate release tablet Take 5 mg by mouth 3 (three) times daily.    Historical Provider, MD  simvastatin (ZOCOR) 10 MG tablet Take 10 mg by mouth at bedtime.    Historical Provider, MD   BP 125/89  Pulse 86  Temp(Src) 97.6 F (36.4 C) (Oral)  Resp 18  SpO2 99%  Physical Exam  Nursing note and vitals reviewed. Constitutional: He is oriented to person, place, and time. He appears well-developed and well-nourished.  HENT:  Head: Normocephalic and atraumatic.  Eyes: EOM are normal.  Neck: Normal range of motion.  Cardiovascular: Normal rate.   Pulmonary/Chest: Effort normal.  Musculoskeletal: Normal range of motion.  S/p left bka. prosthesis inplace  Neurological: He is alert and oriented to person, place, and time.  Skin: Skin is warm and dry.  Psychiatric: He has a normal mood and affect. His behavior is normal.    ED Course  Procedures (including critical care time)  DIAGNOSTIC STUDIES: Oxygen Saturation is 99% on room air, normal by my interpretation.    COORDINATION OF CARE: 12:18 PM- Discussed the hospital's pain management policy and treating the patient acutely in the ED.  The patient agreed to the treatment plan.   Labs Review Labs Reviewed - No data to display  Imaging Review No results found.   EKG Interpretation None      MDM   Final diagnoses:  Chronic pain    Patient here with request for refill on his chronic narcotic medications . I have reviewed the [place] drug database and see that he is getting pain medications from multiple sources. Janeal Holmes explained that ED chronic pain policy and have offered pain medication  Here in the ED, but that I would not be able to fill his chronic pain medications per ED policy. The patient and his wife became extremely irate and were displeased with this policy .    I personally  performed the services described in this documentation, which was scribed in my presence. The recorded information has been reviewed and is accurate.      Margarita Mail, PA-C 04/04/14 2012

## 2014-04-06 NOTE — ED Provider Notes (Signed)
Medical screening examination/treatment/procedure(s) were performed by non-physician practitioner and as supervising physician I was immediately available for consultation/collaboration.   EKG Interpretation None        Houston Siren III, MD 04/06/14 [phone]

## 2014-04-08 ENCOUNTER — Ambulatory Visit: Payer: Self-pay | Admitting: Pain Medicine

## 2014-05-24 DEATH — deceased

## 2014-12-29 ENCOUNTER — Encounter: Payer: Self-pay | Admitting: Gastroenterology

## 2015-03-09 ENCOUNTER — Other Ambulatory Visit: Payer: Self-pay

## 2015-03-09 ENCOUNTER — Ambulatory Visit: Payer: Self-pay | Admitting: Hematology and Oncology

## 2015-03-09 ENCOUNTER — Encounter: Payer: Self-pay | Admitting: *Deleted

## 2015-03-09 NOTE — Progress Notes (Signed)
Patient's death confirmed as noted in [facility]: Brent [name], 58, AWARD-WINNING artist and decorated combat veteran, departed this life unexpectedly on 2014/05/06. I updated Epic.  Gayleen Orem, RN, BSN, [facility] [place] [phone]
